# Patient Record
Sex: Male | Born: 1945 | Race: Black or African American | Hispanic: No | Marital: Married | Smoking: Current every day smoker
Health system: Southern US, Community
[De-identification: ages and names within clinical notes are randomized; demographics above are authoritative.]

## PROBLEM LIST (undated history)

## (undated) DIAGNOSIS — L409 Psoriasis, unspecified: Secondary | ICD-10-CM

## (undated) DIAGNOSIS — R52 Pain, unspecified: Secondary | ICD-10-CM

## (undated) DIAGNOSIS — E559 Vitamin D deficiency, unspecified: Secondary | ICD-10-CM

## (undated) DIAGNOSIS — I7300 Raynaud's syndrome without gangrene: Secondary | ICD-10-CM

## (undated) DIAGNOSIS — I10 Essential (primary) hypertension: Secondary | ICD-10-CM

## (undated) DIAGNOSIS — E785 Hyperlipidemia, unspecified: Secondary | ICD-10-CM

## (undated) DIAGNOSIS — Z720 Tobacco use: Secondary | ICD-10-CM

## (undated) DIAGNOSIS — N400 Enlarged prostate without lower urinary tract symptoms: Secondary | ICD-10-CM

## (undated) DIAGNOSIS — D709 Neutropenia, unspecified: Secondary | ICD-10-CM

## (undated) DIAGNOSIS — E039 Hypothyroidism, unspecified: Secondary | ICD-10-CM

## (undated) DIAGNOSIS — J309 Allergic rhinitis, unspecified: Secondary | ICD-10-CM

## (undated) HISTORY — DX: Hypothyroidism, unspecified: E03.9

## (undated) HISTORY — DX: Allergic rhinitis, unspecified: J30.9

## (undated) HISTORY — DX: Pain, unspecified: R52

## (undated) HISTORY — DX: Raynaud's syndrome without gangrene: I73.00

## (undated) HISTORY — DX: Tobacco use: Z72.0

## (undated) HISTORY — DX: Benign prostatic hyperplasia without lower urinary tract symptoms: N40.0

## (undated) HISTORY — DX: Neutropenia, unspecified: D70.9

## (undated) HISTORY — DX: Psoriasis, unspecified: L40.9

## (undated) HISTORY — DX: Essential (primary) hypertension: I10

## (undated) HISTORY — DX: Vitamin D deficiency, unspecified: E55.9

## (undated) HISTORY — DX: Hyperlipidemia, unspecified: E78.5

---

## 2002-08-13 ENCOUNTER — Ambulatory Visit (HOSPITAL_COMMUNITY): Admission: RE | Admit: 2002-08-13 | Discharge: 2002-08-13 | Payer: Self-pay | Admitting: *Deleted

## 2004-04-15 ENCOUNTER — Encounter: Admission: RE | Admit: 2004-04-15 | Discharge: 2004-04-15 | Payer: Self-pay | Admitting: Family Medicine

## 2004-04-28 ENCOUNTER — Encounter: Admission: RE | Admit: 2004-04-28 | Discharge: 2004-04-28 | Payer: Self-pay | Admitting: Family Medicine

## 2004-11-18 ENCOUNTER — Encounter: Admission: RE | Admit: 2004-11-18 | Discharge: 2004-11-18 | Payer: Self-pay | Admitting: Family Medicine

## 2008-09-30 ENCOUNTER — Encounter: Admission: RE | Admit: 2008-09-30 | Discharge: 2008-09-30 | Payer: Self-pay | Admitting: Allergy and Immunology

## 2008-11-14 ENCOUNTER — Encounter: Admission: RE | Admit: 2008-11-14 | Discharge: 2008-11-14 | Payer: Self-pay | Admitting: Family Medicine

## 2009-10-15 ENCOUNTER — Encounter: Admission: RE | Admit: 2009-10-15 | Discharge: 2009-10-15 | Payer: Self-pay | Admitting: Allergy and Immunology

## 2009-12-16 ENCOUNTER — Encounter: Admission: RE | Admit: 2009-12-16 | Discharge: 2009-12-16 | Payer: Self-pay | Admitting: Family Medicine

## 2010-06-11 ENCOUNTER — Other Ambulatory Visit: Payer: Self-pay | Admitting: Family Medicine

## 2010-06-14 ENCOUNTER — Ambulatory Visit
Admission: RE | Admit: 2010-06-14 | Discharge: 2010-06-14 | Disposition: A | Discharge status: Home / Self Care | Payer: Medicare Other | Source: Ambulatory Visit | Attending: Family Medicine | Admitting: Family Medicine

## 2011-04-04 DIAGNOSIS — Z79899 Other long term (current) drug therapy: Secondary | ICD-10-CM | POA: Diagnosis not present

## 2011-04-04 DIAGNOSIS — Z23 Encounter for immunization: Secondary | ICD-10-CM | POA: Diagnosis not present

## 2011-04-04 DIAGNOSIS — E039 Hypothyroidism, unspecified: Secondary | ICD-10-CM | POA: Diagnosis not present

## 2011-04-04 DIAGNOSIS — E559 Vitamin D deficiency, unspecified: Secondary | ICD-10-CM | POA: Diagnosis not present

## 2011-04-04 DIAGNOSIS — E7800 Pure hypercholesterolemia, unspecified: Secondary | ICD-10-CM | POA: Diagnosis not present

## 2011-04-04 DIAGNOSIS — I10 Essential (primary) hypertension: Secondary | ICD-10-CM | POA: Diagnosis not present

## 2011-04-04 DIAGNOSIS — Z0000 Encounter for general adult medical examination without abnormal findings: Secondary | ICD-10-CM | POA: Diagnosis not present

## 2011-07-13 DIAGNOSIS — L259 Unspecified contact dermatitis, unspecified cause: Secondary | ICD-10-CM | POA: Diagnosis not present

## 2011-07-13 DIAGNOSIS — J3089 Other allergic rhinitis: Secondary | ICD-10-CM | POA: Diagnosis not present

## 2011-07-13 DIAGNOSIS — J019 Acute sinusitis, unspecified: Secondary | ICD-10-CM | POA: Diagnosis not present

## 2011-07-22 DIAGNOSIS — J019 Acute sinusitis, unspecified: Secondary | ICD-10-CM | POA: Diagnosis not present

## 2011-07-22 DIAGNOSIS — J3089 Other allergic rhinitis: Secondary | ICD-10-CM | POA: Diagnosis not present

## 2011-07-22 DIAGNOSIS — L259 Unspecified contact dermatitis, unspecified cause: Secondary | ICD-10-CM | POA: Diagnosis not present

## 2011-10-10 DIAGNOSIS — J3089 Other allergic rhinitis: Secondary | ICD-10-CM | POA: Diagnosis not present

## 2011-10-10 DIAGNOSIS — J019 Acute sinusitis, unspecified: Secondary | ICD-10-CM | POA: Diagnosis not present

## 2011-10-10 DIAGNOSIS — L259 Unspecified contact dermatitis, unspecified cause: Secondary | ICD-10-CM | POA: Diagnosis not present

## 2011-10-17 DIAGNOSIS — E7800 Pure hypercholesterolemia, unspecified: Secondary | ICD-10-CM | POA: Diagnosis not present

## 2011-10-17 DIAGNOSIS — I10 Essential (primary) hypertension: Secondary | ICD-10-CM | POA: Diagnosis not present

## 2011-10-17 DIAGNOSIS — J329 Chronic sinusitis, unspecified: Secondary | ICD-10-CM | POA: Diagnosis not present

## 2011-10-17 DIAGNOSIS — H6120 Impacted cerumen, unspecified ear: Secondary | ICD-10-CM | POA: Diagnosis not present

## 2011-10-17 DIAGNOSIS — Z79899 Other long term (current) drug therapy: Secondary | ICD-10-CM | POA: Diagnosis not present

## 2011-10-17 DIAGNOSIS — E559 Vitamin D deficiency, unspecified: Secondary | ICD-10-CM | POA: Diagnosis not present

## 2011-11-22 DIAGNOSIS — N401 Enlarged prostate with lower urinary tract symptoms: Secondary | ICD-10-CM | POA: Diagnosis not present

## 2012-01-25 DIAGNOSIS — J3089 Other allergic rhinitis: Secondary | ICD-10-CM | POA: Diagnosis not present

## 2012-01-25 DIAGNOSIS — L259 Unspecified contact dermatitis, unspecified cause: Secondary | ICD-10-CM | POA: Diagnosis not present

## 2012-01-25 DIAGNOSIS — J019 Acute sinusitis, unspecified: Secondary | ICD-10-CM | POA: Diagnosis not present

## 2012-04-02 DIAGNOSIS — R209 Unspecified disturbances of skin sensation: Secondary | ICD-10-CM | POA: Diagnosis not present

## 2012-04-06 DIAGNOSIS — J320 Chronic maxillary sinusitis: Secondary | ICD-10-CM | POA: Diagnosis not present

## 2012-04-06 DIAGNOSIS — J310 Chronic rhinitis: Secondary | ICD-10-CM | POA: Diagnosis not present

## 2012-04-06 DIAGNOSIS — H6120 Impacted cerumen, unspecified ear: Secondary | ICD-10-CM | POA: Diagnosis not present

## 2012-04-09 ENCOUNTER — Other Ambulatory Visit (INDEPENDENT_AMBULATORY_CARE_PROVIDER_SITE_OTHER): Payer: Self-pay | Admitting: Otolaryngology

## 2012-04-09 DIAGNOSIS — J329 Chronic sinusitis, unspecified: Secondary | ICD-10-CM

## 2012-04-11 DIAGNOSIS — Z79899 Other long term (current) drug therapy: Secondary | ICD-10-CM | POA: Diagnosis not present

## 2012-04-11 DIAGNOSIS — Z23 Encounter for immunization: Secondary | ICD-10-CM | POA: Diagnosis not present

## 2012-04-11 DIAGNOSIS — F17200 Nicotine dependence, unspecified, uncomplicated: Secondary | ICD-10-CM | POA: Diagnosis not present

## 2012-04-11 DIAGNOSIS — I10 Essential (primary) hypertension: Secondary | ICD-10-CM | POA: Diagnosis not present

## 2012-04-11 DIAGNOSIS — E039 Hypothyroidism, unspecified: Secondary | ICD-10-CM | POA: Diagnosis not present

## 2012-04-11 DIAGNOSIS — E559 Vitamin D deficiency, unspecified: Secondary | ICD-10-CM | POA: Diagnosis not present

## 2012-04-11 DIAGNOSIS — E7800 Pure hypercholesterolemia, unspecified: Secondary | ICD-10-CM | POA: Diagnosis not present

## 2012-04-11 DIAGNOSIS — Z0000 Encounter for general adult medical examination without abnormal findings: Secondary | ICD-10-CM | POA: Diagnosis not present

## 2012-04-11 DIAGNOSIS — Z1331 Encounter for screening for depression: Secondary | ICD-10-CM | POA: Diagnosis not present

## 2012-04-13 ENCOUNTER — Ambulatory Visit
Admission: RE | Admit: 2012-04-13 | Discharge: 2012-04-13 | Disposition: A | Discharge status: Home / Self Care | Payer: Medicare Other | Source: Ambulatory Visit | Attending: Otolaryngology | Admitting: Otolaryngology

## 2012-04-13 DIAGNOSIS — J320 Chronic maxillary sinusitis: Secondary | ICD-10-CM | POA: Diagnosis not present

## 2012-04-13 DIAGNOSIS — J329 Chronic sinusitis, unspecified: Secondary | ICD-10-CM

## 2012-04-27 DIAGNOSIS — J343 Hypertrophy of nasal turbinates: Secondary | ICD-10-CM | POA: Diagnosis not present

## 2012-04-27 DIAGNOSIS — J310 Chronic rhinitis: Secondary | ICD-10-CM | POA: Diagnosis not present

## 2012-04-27 DIAGNOSIS — J0100 Acute maxillary sinusitis, unspecified: Secondary | ICD-10-CM | POA: Diagnosis not present

## 2012-05-09 DIAGNOSIS — F17200 Nicotine dependence, unspecified, uncomplicated: Secondary | ICD-10-CM | POA: Diagnosis not present

## 2012-05-09 DIAGNOSIS — I10 Essential (primary) hypertension: Secondary | ICD-10-CM | POA: Diagnosis not present

## 2012-10-22 DIAGNOSIS — J322 Chronic ethmoidal sinusitis: Secondary | ICD-10-CM | POA: Diagnosis not present

## 2012-10-22 DIAGNOSIS — J0100 Acute maxillary sinusitis, unspecified: Secondary | ICD-10-CM | POA: Diagnosis not present

## 2012-10-22 DIAGNOSIS — J320 Chronic maxillary sinusitis: Secondary | ICD-10-CM | POA: Diagnosis not present

## 2012-11-12 DIAGNOSIS — J0100 Acute maxillary sinusitis, unspecified: Secondary | ICD-10-CM | POA: Diagnosis not present

## 2012-11-12 DIAGNOSIS — J343 Hypertrophy of nasal turbinates: Secondary | ICD-10-CM | POA: Diagnosis not present

## 2012-11-12 DIAGNOSIS — J310 Chronic rhinitis: Secondary | ICD-10-CM | POA: Diagnosis not present

## 2012-11-26 DIAGNOSIS — J3089 Other allergic rhinitis: Secondary | ICD-10-CM | POA: Diagnosis not present

## 2012-11-26 DIAGNOSIS — L259 Unspecified contact dermatitis, unspecified cause: Secondary | ICD-10-CM | POA: Diagnosis not present

## 2012-12-05 DIAGNOSIS — E039 Hypothyroidism, unspecified: Secondary | ICD-10-CM | POA: Diagnosis not present

## 2012-12-05 DIAGNOSIS — I10 Essential (primary) hypertension: Secondary | ICD-10-CM | POA: Diagnosis not present

## 2012-12-05 DIAGNOSIS — Z1211 Encounter for screening for malignant neoplasm of colon: Secondary | ICD-10-CM | POA: Diagnosis not present

## 2012-12-05 DIAGNOSIS — F17200 Nicotine dependence, unspecified, uncomplicated: Secondary | ICD-10-CM | POA: Diagnosis not present

## 2012-12-05 DIAGNOSIS — Z79899 Other long term (current) drug therapy: Secondary | ICD-10-CM | POA: Diagnosis not present

## 2013-02-18 DIAGNOSIS — J343 Hypertrophy of nasal turbinates: Secondary | ICD-10-CM | POA: Diagnosis not present

## 2013-02-18 DIAGNOSIS — J310 Chronic rhinitis: Secondary | ICD-10-CM | POA: Diagnosis not present

## 2013-02-18 DIAGNOSIS — J0100 Acute maxillary sinusitis, unspecified: Secondary | ICD-10-CM | POA: Diagnosis not present

## 2013-02-18 DIAGNOSIS — H6120 Impacted cerumen, unspecified ear: Secondary | ICD-10-CM | POA: Diagnosis not present

## 2013-03-11 DIAGNOSIS — L259 Unspecified contact dermatitis, unspecified cause: Secondary | ICD-10-CM | POA: Diagnosis not present

## 2013-03-11 DIAGNOSIS — J3089 Other allergic rhinitis: Secondary | ICD-10-CM | POA: Diagnosis not present

## 2013-03-11 DIAGNOSIS — J019 Acute sinusitis, unspecified: Secondary | ICD-10-CM | POA: Diagnosis not present

## 2013-06-10 DIAGNOSIS — E039 Hypothyroidism, unspecified: Secondary | ICD-10-CM | POA: Diagnosis not present

## 2013-06-10 DIAGNOSIS — I10 Essential (primary) hypertension: Secondary | ICD-10-CM | POA: Diagnosis not present

## 2013-06-10 DIAGNOSIS — E7800 Pure hypercholesterolemia, unspecified: Secondary | ICD-10-CM | POA: Diagnosis not present

## 2013-06-10 DIAGNOSIS — F17200 Nicotine dependence, unspecified, uncomplicated: Secondary | ICD-10-CM | POA: Diagnosis not present

## 2013-06-10 DIAGNOSIS — Z1211 Encounter for screening for malignant neoplasm of colon: Secondary | ICD-10-CM | POA: Diagnosis not present

## 2013-06-10 DIAGNOSIS — Z23 Encounter for immunization: Secondary | ICD-10-CM | POA: Diagnosis not present

## 2013-06-10 DIAGNOSIS — Z0000 Encounter for general adult medical examination without abnormal findings: Secondary | ICD-10-CM | POA: Diagnosis not present

## 2013-06-11 ENCOUNTER — Other Ambulatory Visit: Payer: Self-pay | Admitting: Family Medicine

## 2013-06-11 DIAGNOSIS — Z139 Encounter for screening, unspecified: Secondary | ICD-10-CM

## 2013-06-27 DIAGNOSIS — N400 Enlarged prostate without lower urinary tract symptoms: Secondary | ICD-10-CM | POA: Diagnosis not present

## 2013-07-24 DIAGNOSIS — I10 Essential (primary) hypertension: Secondary | ICD-10-CM | POA: Diagnosis not present

## 2013-09-18 DIAGNOSIS — J019 Acute sinusitis, unspecified: Secondary | ICD-10-CM | POA: Diagnosis not present

## 2013-09-18 DIAGNOSIS — L259 Unspecified contact dermatitis, unspecified cause: Secondary | ICD-10-CM | POA: Diagnosis not present

## 2013-09-18 DIAGNOSIS — J3089 Other allergic rhinitis: Secondary | ICD-10-CM | POA: Diagnosis not present

## 2014-01-10 DIAGNOSIS — J310 Chronic rhinitis: Secondary | ICD-10-CM | POA: Diagnosis not present

## 2014-01-10 DIAGNOSIS — H6123 Impacted cerumen, bilateral: Secondary | ICD-10-CM | POA: Diagnosis not present

## 2014-01-10 DIAGNOSIS — J343 Hypertrophy of nasal turbinates: Secondary | ICD-10-CM | POA: Diagnosis not present

## 2014-01-31 DIAGNOSIS — M549 Dorsalgia, unspecified: Secondary | ICD-10-CM | POA: Diagnosis not present

## 2014-02-03 DIAGNOSIS — M4806 Spinal stenosis, lumbar region: Secondary | ICD-10-CM | POA: Diagnosis not present

## 2014-02-03 DIAGNOSIS — M545 Low back pain: Secondary | ICD-10-CM | POA: Diagnosis not present

## 2014-03-03 DIAGNOSIS — J3089 Other allergic rhinitis: Secondary | ICD-10-CM | POA: Diagnosis not present

## 2014-03-03 DIAGNOSIS — J0100 Acute maxillary sinusitis, unspecified: Secondary | ICD-10-CM | POA: Diagnosis not present

## 2014-03-03 DIAGNOSIS — L309 Dermatitis, unspecified: Secondary | ICD-10-CM | POA: Diagnosis not present

## 2014-04-30 DIAGNOSIS — L309 Dermatitis, unspecified: Secondary | ICD-10-CM | POA: Diagnosis not present

## 2014-04-30 DIAGNOSIS — H6121 Impacted cerumen, right ear: Secondary | ICD-10-CM | POA: Diagnosis not present

## 2014-04-30 DIAGNOSIS — J3089 Other allergic rhinitis: Secondary | ICD-10-CM | POA: Diagnosis not present

## 2014-05-02 DIAGNOSIS — H6123 Impacted cerumen, bilateral: Secondary | ICD-10-CM | POA: Diagnosis not present

## 2014-06-17 DIAGNOSIS — L03211 Cellulitis of face: Secondary | ICD-10-CM | POA: Diagnosis not present

## 2014-06-17 DIAGNOSIS — J343 Hypertrophy of nasal turbinates: Secondary | ICD-10-CM | POA: Diagnosis not present

## 2014-06-17 DIAGNOSIS — J310 Chronic rhinitis: Secondary | ICD-10-CM | POA: Diagnosis not present

## 2014-06-24 DIAGNOSIS — L03211 Cellulitis of face: Secondary | ICD-10-CM | POA: Diagnosis not present

## 2014-06-25 DIAGNOSIS — E780 Pure hypercholesterolemia: Secondary | ICD-10-CM | POA: Diagnosis not present

## 2014-06-25 DIAGNOSIS — L409 Psoriasis, unspecified: Secondary | ICD-10-CM | POA: Diagnosis not present

## 2014-06-25 DIAGNOSIS — F17210 Nicotine dependence, cigarettes, uncomplicated: Secondary | ICD-10-CM | POA: Diagnosis not present

## 2014-06-25 DIAGNOSIS — E559 Vitamin D deficiency, unspecified: Secondary | ICD-10-CM | POA: Diagnosis not present

## 2014-06-25 DIAGNOSIS — I10 Essential (primary) hypertension: Secondary | ICD-10-CM | POA: Diagnosis not present

## 2014-06-25 DIAGNOSIS — E039 Hypothyroidism, unspecified: Secondary | ICD-10-CM | POA: Diagnosis not present

## 2014-06-25 DIAGNOSIS — Z5181 Encounter for therapeutic drug level monitoring: Secondary | ICD-10-CM | POA: Diagnosis not present

## 2014-06-25 DIAGNOSIS — Z0000 Encounter for general adult medical examination without abnormal findings: Secondary | ICD-10-CM | POA: Diagnosis not present

## 2014-06-25 DIAGNOSIS — Z1389 Encounter for screening for other disorder: Secondary | ICD-10-CM | POA: Diagnosis not present

## 2014-06-30 DIAGNOSIS — E871 Hypo-osmolality and hyponatremia: Secondary | ICD-10-CM | POA: Diagnosis not present

## 2014-07-14 DIAGNOSIS — E871 Hypo-osmolality and hyponatremia: Secondary | ICD-10-CM | POA: Diagnosis not present

## 2014-07-24 DIAGNOSIS — K640 First degree hemorrhoids: Secondary | ICD-10-CM | POA: Diagnosis not present

## 2014-07-24 DIAGNOSIS — Z1211 Encounter for screening for malignant neoplasm of colon: Secondary | ICD-10-CM | POA: Diagnosis not present

## 2014-08-13 DIAGNOSIS — T148 Other injury of unspecified body region: Secondary | ICD-10-CM | POA: Diagnosis not present

## 2014-09-17 DIAGNOSIS — R3915 Urgency of urination: Secondary | ICD-10-CM | POA: Diagnosis not present

## 2014-09-17 DIAGNOSIS — N401 Enlarged prostate with lower urinary tract symptoms: Secondary | ICD-10-CM | POA: Diagnosis not present

## 2014-11-03 DIAGNOSIS — E780 Pure hypercholesterolemia: Secondary | ICD-10-CM | POA: Diagnosis not present

## 2014-11-07 DIAGNOSIS — E871 Hypo-osmolality and hyponatremia: Secondary | ICD-10-CM | POA: Diagnosis not present

## 2014-11-07 DIAGNOSIS — M25569 Pain in unspecified knee: Secondary | ICD-10-CM | POA: Diagnosis not present

## 2014-11-07 DIAGNOSIS — M791 Myalgia: Secondary | ICD-10-CM | POA: Diagnosis not present

## 2014-12-02 DIAGNOSIS — H6122 Impacted cerumen, left ear: Secondary | ICD-10-CM | POA: Diagnosis not present

## 2014-12-02 DIAGNOSIS — J342 Deviated nasal septum: Secondary | ICD-10-CM | POA: Diagnosis not present

## 2014-12-02 DIAGNOSIS — J310 Chronic rhinitis: Secondary | ICD-10-CM | POA: Diagnosis not present

## 2014-12-24 DIAGNOSIS — L309 Dermatitis, unspecified: Secondary | ICD-10-CM | POA: Diagnosis not present

## 2014-12-24 DIAGNOSIS — J3089 Other allergic rhinitis: Secondary | ICD-10-CM | POA: Diagnosis not present

## 2014-12-24 DIAGNOSIS — J069 Acute upper respiratory infection, unspecified: Secondary | ICD-10-CM | POA: Diagnosis not present

## 2015-03-04 DIAGNOSIS — J3089 Other allergic rhinitis: Secondary | ICD-10-CM | POA: Diagnosis not present

## 2015-03-04 DIAGNOSIS — L309 Dermatitis, unspecified: Secondary | ICD-10-CM | POA: Diagnosis not present

## 2015-03-27 DIAGNOSIS — L309 Dermatitis, unspecified: Secondary | ICD-10-CM | POA: Diagnosis not present

## 2015-03-27 DIAGNOSIS — J3089 Other allergic rhinitis: Secondary | ICD-10-CM | POA: Diagnosis not present

## 2015-03-27 DIAGNOSIS — J069 Acute upper respiratory infection, unspecified: Secondary | ICD-10-CM | POA: Diagnosis not present

## 2015-03-30 DIAGNOSIS — J0101 Acute recurrent maxillary sinusitis: Secondary | ICD-10-CM | POA: Diagnosis not present

## 2015-03-30 DIAGNOSIS — J343 Hypertrophy of nasal turbinates: Secondary | ICD-10-CM | POA: Diagnosis not present

## 2015-03-30 DIAGNOSIS — H6122 Impacted cerumen, left ear: Secondary | ICD-10-CM | POA: Diagnosis not present

## 2015-03-30 DIAGNOSIS — J310 Chronic rhinitis: Secondary | ICD-10-CM | POA: Diagnosis not present

## 2015-06-19 DIAGNOSIS — J310 Chronic rhinitis: Secondary | ICD-10-CM | POA: Diagnosis not present

## 2015-06-19 DIAGNOSIS — J0101 Acute recurrent maxillary sinusitis: Secondary | ICD-10-CM | POA: Diagnosis not present

## 2015-06-29 DIAGNOSIS — E559 Vitamin D deficiency, unspecified: Secondary | ICD-10-CM | POA: Diagnosis not present

## 2015-06-29 DIAGNOSIS — I10 Essential (primary) hypertension: Secondary | ICD-10-CM | POA: Diagnosis not present

## 2015-06-29 DIAGNOSIS — Z0000 Encounter for general adult medical examination without abnormal findings: Secondary | ICD-10-CM | POA: Diagnosis not present

## 2015-06-29 DIAGNOSIS — E7800 Pure hypercholesterolemia, unspecified: Secondary | ICD-10-CM | POA: Diagnosis not present

## 2015-06-29 DIAGNOSIS — E039 Hypothyroidism, unspecified: Secondary | ICD-10-CM | POA: Diagnosis not present

## 2015-06-29 DIAGNOSIS — F17210 Nicotine dependence, cigarettes, uncomplicated: Secondary | ICD-10-CM | POA: Diagnosis not present

## 2015-06-29 DIAGNOSIS — M170 Bilateral primary osteoarthritis of knee: Secondary | ICD-10-CM | POA: Diagnosis not present

## 2015-11-18 DIAGNOSIS — M79672 Pain in left foot: Secondary | ICD-10-CM | POA: Diagnosis not present

## 2015-11-18 DIAGNOSIS — M79671 Pain in right foot: Secondary | ICD-10-CM | POA: Diagnosis not present

## 2015-12-24 DIAGNOSIS — J0191 Acute recurrent sinusitis, unspecified: Secondary | ICD-10-CM | POA: Diagnosis not present

## 2015-12-24 DIAGNOSIS — L309 Dermatitis, unspecified: Secondary | ICD-10-CM | POA: Diagnosis not present

## 2015-12-24 DIAGNOSIS — J3089 Other allergic rhinitis: Secondary | ICD-10-CM | POA: Diagnosis not present

## 2016-01-14 DIAGNOSIS — J309 Allergic rhinitis, unspecified: Secondary | ICD-10-CM | POA: Diagnosis not present

## 2016-01-14 DIAGNOSIS — R42 Dizziness and giddiness: Secondary | ICD-10-CM | POA: Diagnosis not present

## 2016-01-14 DIAGNOSIS — I10 Essential (primary) hypertension: Secondary | ICD-10-CM | POA: Diagnosis not present

## 2016-01-14 DIAGNOSIS — E7800 Pure hypercholesterolemia, unspecified: Secondary | ICD-10-CM | POA: Diagnosis not present

## 2016-01-20 DIAGNOSIS — J343 Hypertrophy of nasal turbinates: Secondary | ICD-10-CM | POA: Diagnosis not present

## 2016-01-20 DIAGNOSIS — J342 Deviated nasal septum: Secondary | ICD-10-CM | POA: Diagnosis not present

## 2016-01-20 DIAGNOSIS — H6123 Impacted cerumen, bilateral: Secondary | ICD-10-CM | POA: Diagnosis not present

## 2016-01-20 DIAGNOSIS — H900 Conductive hearing loss, bilateral: Secondary | ICD-10-CM | POA: Diagnosis not present

## 2016-01-20 DIAGNOSIS — H6983 Other specified disorders of Eustachian tube, bilateral: Secondary | ICD-10-CM | POA: Diagnosis not present

## 2016-01-20 DIAGNOSIS — J310 Chronic rhinitis: Secondary | ICD-10-CM | POA: Diagnosis not present

## 2016-03-17 DIAGNOSIS — F17210 Nicotine dependence, cigarettes, uncomplicated: Secondary | ICD-10-CM | POA: Diagnosis not present

## 2016-03-17 DIAGNOSIS — E039 Hypothyroidism, unspecified: Secondary | ICD-10-CM | POA: Diagnosis not present

## 2016-03-17 DIAGNOSIS — E7800 Pure hypercholesterolemia, unspecified: Secondary | ICD-10-CM | POA: Diagnosis not present

## 2016-03-17 DIAGNOSIS — L309 Dermatitis, unspecified: Secondary | ICD-10-CM | POA: Diagnosis not present

## 2016-03-17 DIAGNOSIS — I10 Essential (primary) hypertension: Secondary | ICD-10-CM | POA: Diagnosis not present

## 2016-05-10 DIAGNOSIS — H6123 Impacted cerumen, bilateral: Secondary | ICD-10-CM | POA: Diagnosis not present

## 2016-05-10 DIAGNOSIS — J342 Deviated nasal septum: Secondary | ICD-10-CM | POA: Diagnosis not present

## 2016-05-10 DIAGNOSIS — J310 Chronic rhinitis: Secondary | ICD-10-CM | POA: Diagnosis not present

## 2016-05-11 DIAGNOSIS — E871 Hypo-osmolality and hyponatremia: Secondary | ICD-10-CM | POA: Diagnosis not present

## 2016-06-07 DIAGNOSIS — N400 Enlarged prostate without lower urinary tract symptoms: Secondary | ICD-10-CM | POA: Diagnosis not present

## 2016-06-07 DIAGNOSIS — Z125 Encounter for screening for malignant neoplasm of prostate: Secondary | ICD-10-CM | POA: Diagnosis not present

## 2016-06-16 DIAGNOSIS — B353 Tinea pedis: Secondary | ICD-10-CM | POA: Diagnosis not present

## 2016-06-16 DIAGNOSIS — M79605 Pain in left leg: Secondary | ICD-10-CM | POA: Diagnosis not present

## 2016-06-30 DIAGNOSIS — M170 Bilateral primary osteoarthritis of knee: Secondary | ICD-10-CM | POA: Diagnosis not present

## 2016-06-30 DIAGNOSIS — M48061 Spinal stenosis, lumbar region without neurogenic claudication: Secondary | ICD-10-CM | POA: Diagnosis not present

## 2016-06-30 DIAGNOSIS — L309 Dermatitis, unspecified: Secondary | ICD-10-CM | POA: Diagnosis not present

## 2016-06-30 DIAGNOSIS — M545 Low back pain: Secondary | ICD-10-CM | POA: Diagnosis not present

## 2016-06-30 DIAGNOSIS — J3089 Other allergic rhinitis: Secondary | ICD-10-CM | POA: Diagnosis not present

## 2016-06-30 DIAGNOSIS — J0111 Acute recurrent frontal sinusitis: Secondary | ICD-10-CM | POA: Diagnosis not present

## 2016-07-11 DIAGNOSIS — M170 Bilateral primary osteoarthritis of knee: Secondary | ICD-10-CM | POA: Diagnosis not present

## 2016-07-11 DIAGNOSIS — Z0000 Encounter for general adult medical examination without abnormal findings: Secondary | ICD-10-CM | POA: Diagnosis not present

## 2016-07-11 DIAGNOSIS — F17210 Nicotine dependence, cigarettes, uncomplicated: Secondary | ICD-10-CM | POA: Diagnosis not present

## 2016-07-11 DIAGNOSIS — E559 Vitamin D deficiency, unspecified: Secondary | ICD-10-CM | POA: Diagnosis not present

## 2016-07-11 DIAGNOSIS — E7800 Pure hypercholesterolemia, unspecified: Secondary | ICD-10-CM | POA: Diagnosis not present

## 2016-07-11 DIAGNOSIS — I10 Essential (primary) hypertension: Secondary | ICD-10-CM | POA: Diagnosis not present

## 2016-07-11 DIAGNOSIS — E039 Hypothyroidism, unspecified: Secondary | ICD-10-CM | POA: Diagnosis not present

## 2016-08-09 DIAGNOSIS — J3089 Other allergic rhinitis: Secondary | ICD-10-CM | POA: Diagnosis not present

## 2016-08-09 DIAGNOSIS — L309 Dermatitis, unspecified: Secondary | ICD-10-CM | POA: Diagnosis not present

## 2016-08-09 DIAGNOSIS — J069 Acute upper respiratory infection, unspecified: Secondary | ICD-10-CM | POA: Diagnosis not present

## 2016-08-26 DIAGNOSIS — J3089 Other allergic rhinitis: Secondary | ICD-10-CM | POA: Diagnosis not present

## 2016-08-26 DIAGNOSIS — L309 Dermatitis, unspecified: Secondary | ICD-10-CM | POA: Diagnosis not present

## 2016-08-26 DIAGNOSIS — F17200 Nicotine dependence, unspecified, uncomplicated: Secondary | ICD-10-CM | POA: Diagnosis not present

## 2016-08-26 DIAGNOSIS — J019 Acute sinusitis, unspecified: Secondary | ICD-10-CM | POA: Diagnosis not present

## 2016-10-18 DIAGNOSIS — B353 Tinea pedis: Secondary | ICD-10-CM | POA: Diagnosis not present

## 2016-10-18 DIAGNOSIS — J320 Chronic maxillary sinusitis: Secondary | ICD-10-CM | POA: Diagnosis not present

## 2016-10-18 DIAGNOSIS — M25569 Pain in unspecified knee: Secondary | ICD-10-CM | POA: Diagnosis not present

## 2016-10-18 DIAGNOSIS — J309 Allergic rhinitis, unspecified: Secondary | ICD-10-CM | POA: Diagnosis not present

## 2016-11-23 DIAGNOSIS — L309 Dermatitis, unspecified: Secondary | ICD-10-CM | POA: Diagnosis not present

## 2016-11-23 DIAGNOSIS — J3089 Other allergic rhinitis: Secondary | ICD-10-CM | POA: Diagnosis not present

## 2016-11-23 DIAGNOSIS — J069 Acute upper respiratory infection, unspecified: Secondary | ICD-10-CM | POA: Diagnosis not present

## 2017-01-25 DIAGNOSIS — F17210 Nicotine dependence, cigarettes, uncomplicated: Secondary | ICD-10-CM | POA: Diagnosis not present

## 2017-01-25 DIAGNOSIS — H6123 Impacted cerumen, bilateral: Secondary | ICD-10-CM | POA: Diagnosis not present

## 2017-01-25 DIAGNOSIS — Z23 Encounter for immunization: Secondary | ICD-10-CM | POA: Diagnosis not present

## 2017-01-25 DIAGNOSIS — I10 Essential (primary) hypertension: Secondary | ICD-10-CM | POA: Diagnosis not present

## 2017-01-25 DIAGNOSIS — E039 Hypothyroidism, unspecified: Secondary | ICD-10-CM | POA: Diagnosis not present

## 2017-01-25 DIAGNOSIS — E7800 Pure hypercholesterolemia, unspecified: Secondary | ICD-10-CM | POA: Diagnosis not present

## 2017-03-09 DIAGNOSIS — E039 Hypothyroidism, unspecified: Secondary | ICD-10-CM | POA: Diagnosis not present

## 2017-04-04 DIAGNOSIS — F17200 Nicotine dependence, unspecified, uncomplicated: Secondary | ICD-10-CM | POA: Diagnosis not present

## 2017-04-04 DIAGNOSIS — L309 Dermatitis, unspecified: Secondary | ICD-10-CM | POA: Diagnosis not present

## 2017-04-04 DIAGNOSIS — J0100 Acute maxillary sinusitis, unspecified: Secondary | ICD-10-CM | POA: Diagnosis not present

## 2017-04-04 DIAGNOSIS — J3089 Other allergic rhinitis: Secondary | ICD-10-CM | POA: Diagnosis not present

## 2017-04-26 ENCOUNTER — Other Ambulatory Visit: Payer: Self-pay | Admitting: Family Medicine

## 2017-04-26 ENCOUNTER — Ambulatory Visit
Admission: RE | Admit: 2017-04-26 | Discharge: 2017-04-26 | Disposition: A | Discharge status: Home / Self Care | Payer: Medicare Other | Source: Ambulatory Visit | Attending: Family Medicine | Admitting: Family Medicine

## 2017-04-26 DIAGNOSIS — M25561 Pain in right knee: Secondary | ICD-10-CM | POA: Diagnosis not present

## 2017-04-26 DIAGNOSIS — E039 Hypothyroidism, unspecified: Secondary | ICD-10-CM | POA: Diagnosis not present

## 2017-04-26 DIAGNOSIS — R52 Pain, unspecified: Secondary | ICD-10-CM

## 2017-04-26 DIAGNOSIS — M25569 Pain in unspecified knee: Secondary | ICD-10-CM | POA: Diagnosis not present

## 2017-04-26 DIAGNOSIS — M25562 Pain in left knee: Secondary | ICD-10-CM | POA: Diagnosis not present

## 2017-05-09 DIAGNOSIS — H6123 Impacted cerumen, bilateral: Secondary | ICD-10-CM | POA: Diagnosis not present

## 2017-05-09 DIAGNOSIS — J343 Hypertrophy of nasal turbinates: Secondary | ICD-10-CM | POA: Diagnosis not present

## 2017-05-09 DIAGNOSIS — J310 Chronic rhinitis: Secondary | ICD-10-CM | POA: Diagnosis not present

## 2017-05-09 DIAGNOSIS — J342 Deviated nasal septum: Secondary | ICD-10-CM | POA: Diagnosis not present

## 2017-05-16 DIAGNOSIS — M791 Myalgia, unspecified site: Secondary | ICD-10-CM | POA: Diagnosis not present

## 2017-05-16 DIAGNOSIS — E559 Vitamin D deficiency, unspecified: Secondary | ICD-10-CM | POA: Diagnosis not present

## 2017-05-16 DIAGNOSIS — E039 Hypothyroidism, unspecified: Secondary | ICD-10-CM | POA: Diagnosis not present

## 2017-06-01 DIAGNOSIS — J329 Chronic sinusitis, unspecified: Secondary | ICD-10-CM | POA: Diagnosis not present

## 2017-06-01 DIAGNOSIS — F17210 Nicotine dependence, cigarettes, uncomplicated: Secondary | ICD-10-CM | POA: Diagnosis not present

## 2017-06-01 DIAGNOSIS — M79604 Pain in right leg: Secondary | ICD-10-CM | POA: Diagnosis not present

## 2017-06-01 DIAGNOSIS — B353 Tinea pedis: Secondary | ICD-10-CM | POA: Diagnosis not present

## 2017-06-05 ENCOUNTER — Emergency Department (HOSPITAL_COMMUNITY): Payer: Medicare Other

## 2017-06-05 ENCOUNTER — Emergency Department (HOSPITAL_COMMUNITY)
Admission: EM | Admit: 2017-06-05 | Discharge: 2017-06-05 | Disposition: A | Discharge status: Home / Self Care | Payer: Medicare Other | Attending: Emergency Medicine | Admitting: Emergency Medicine

## 2017-06-05 ENCOUNTER — Encounter (HOSPITAL_COMMUNITY): Payer: Self-pay | Admitting: Emergency Medicine

## 2017-06-05 DIAGNOSIS — T189XXA Foreign body of alimentary tract, part unspecified, initial encounter: Secondary | ICD-10-CM | POA: Insufficient documentation

## 2017-06-05 DIAGNOSIS — F17210 Nicotine dependence, cigarettes, uncomplicated: Secondary | ICD-10-CM | POA: Diagnosis not present

## 2017-06-05 DIAGNOSIS — X58XXXA Exposure to other specified factors, initial encounter: Secondary | ICD-10-CM | POA: Insufficient documentation

## 2017-06-05 DIAGNOSIS — Y9389 Activity, other specified: Secondary | ICD-10-CM | POA: Insufficient documentation

## 2017-06-05 DIAGNOSIS — S1095XA Superficial foreign body of unspecified part of neck, initial encounter: Secondary | ICD-10-CM | POA: Diagnosis not present

## 2017-06-05 DIAGNOSIS — I10 Essential (primary) hypertension: Secondary | ICD-10-CM | POA: Diagnosis not present

## 2017-06-05 DIAGNOSIS — Y929 Unspecified place or not applicable: Secondary | ICD-10-CM | POA: Diagnosis not present

## 2017-06-05 DIAGNOSIS — Z7982 Long term (current) use of aspirin: Secondary | ICD-10-CM | POA: Insufficient documentation

## 2017-06-05 DIAGNOSIS — E039 Hypothyroidism, unspecified: Secondary | ICD-10-CM | POA: Insufficient documentation

## 2017-06-05 DIAGNOSIS — Z79899 Other long term (current) drug therapy: Secondary | ICD-10-CM | POA: Diagnosis not present

## 2017-06-05 DIAGNOSIS — M795 Residual foreign body in soft tissue: Secondary | ICD-10-CM

## 2017-06-05 DIAGNOSIS — Y999 Unspecified external cause status: Secondary | ICD-10-CM | POA: Insufficient documentation

## 2017-06-05 NOTE — ED Provider Notes (Signed)
[facility]-EMERGENCY DEPT Provider Note   CSN: [id number] Arrival date & time: 06/05/17  1503     History   Chief Complaint Chief Complaint  Patient presents with  . Foreign Body in Throat    HPI Rickey Austin is a 72 y.o. male who presents to the ED with ? F/b in throat. Patient reports eating a pepperment candy and it feels like it is stuck in his throat. Patient is able to speak and swallow.  HPI  Past Medical History:  Diagnosis Date  . Allergic rhinitis   . BPH (benign prostatic hyperplasia)     Dr. Brunilda PayorNesi  . Hyperlipidemia   . Hypertension   . Hypothyroidism    Unspecified   . Neutropenia (HCC)    Neutropenia and macrocytosis  . Pain    Joint pains, multiple - including DDD  . Psoriasis   . Raynaud's phenomenon   . Tobacco abuse   . Tobacco abuse   . Vitamin D deficiency     There are no active problems to display for this patient.   History reviewed. No pertinent surgical history.     Home Medications    Prior to Admission medications   Medication Sig Start Date End Date Taking? Authorizing Provider  aspirin 81 MG tablet Take 81 mg by mouth daily. Aspir-81 81 MG Tablet Delayed Release 1 tablet once a day    [provider]  Cetirizine HCl (ZYRTEC ALLERGY PO) Take by mouth. ZyrTEC 10 MG Tablet Chewable 1 tablet as needed    [provider]  Cholecalciferol (VITAMIN D PO) Take by mouth. Vitamin D 5000 IU Tablet 1 tablet occas    [provider]  Fluticasone Propionate (FLONASE NA) Place into the nose. Flonase 50 MCG/ACT Suspension 1 spray in each nostril Once a day    [provider]  Ibuprofen (ADVIL PO) Take by mouth. Advil 200 MG Tablet 1 tablet as needed    [provider]  Levothyroxine Sodium (SYNTHROID PO) Take by mouth. Synthroid 100 MCG Tablet 1 tablet once a day    [provider]  lisinopril (PRINIVIL,ZESTRIL) 10 MG tablet Take 10 mg by mouth daily. Lisinopril 10 MG  Tablet 1 tablet once a day    [provider]  MAGNESIUM OXIDE PO Take by mouth. Magnesium Oxide 400 MG Tablet 1 tablet as needed    [provider]  Multiple Vitamin (MULTIVITAMIN) tablet Take 1 tablet by mouth daily. For Him 50+ Tablet 1 tablet once a day    [provider]    Family History Family History  Problem Relation Age of Onset  . Osteoarthritis Mother   . Hypertension Mother   . Heart attack Father   . Gout Sister   . Diabetes Sister   . Diabetes Brother   . Heart disease Brother   . Gout Brother   . Diabetes Brother     Social History Social History   Tobacco Use  . Smoking status: Current Every Day Smoker  . Smokeless tobacco: Never Used  Substance Use Topics  . Alcohol use: No    Frequency: Never  . Drug use: No     Allergies   Avelox [moxifloxacin hcl in nacl]; Levaquin [levofloxacin in d5w]; and Penicillins   Review of Systems Review of Systems  HENT: Positive for sore throat.   All other systems reviewed and are negative.    Physical Exam Updated Vital Signs BP (!) 159/77 (BP Location: Right Arm)  Pulse 74   Temp 97.7 F (36.5 C) (Oral)   Resp 16   Ht 5\' 6"  (1.676 m)   Wt 77.6 kg (171 lb)   SpO2 99%   BMI 27.60 kg/m   Physical Exam  Constitutional: He is oriented to person, place, and time. He appears well-developed and well-nourished. No distress.  HENT:  Head: Normocephalic.  Mouth/Throat: Uvula is midline, oropharynx is clear and moist and mucous membranes are normal. No uvula swelling. No posterior oropharyngeal edema or posterior oropharyngeal erythema.  No foreign body noted in throat.  Eyes: EOM are normal.  Neck: Neck supple.  Cardiovascular: Normal rate.  Pulmonary/Chest: Effort normal.  Abdominal: Soft. There is no tenderness.  Musculoskeletal: Normal range of motion.  Neurological: He is alert and oriented to person, place, and time. No cranial nerve deficit.  Skin: Skin is warm and dry.    Nursing note and vitals reviewed.    ED Treatments / Results  Labs (all labs ordered are listed, but only abnormal results are displayed) Labs Reviewed - No data to display  Radiology Dg Neck Soft Tissue  Result Date: 06/05/2017 CLINICAL DATA:  Sensation of foreign body in the neck. The patient choked on a peppermint today. EXAM: NECK SOFT TISSUES - 1+ VIEW COMPARISON:  Cervical MRI dated 07/02/2007 FINDINGS: There is no evidence of retropharyngeal soft tissue swelling or epiglottic enlargement. The cervical airway is unremarkable and no radio-opaque foreign body identified. Slight calcifications in the carotid arteries. IMPRESSION: Negative. Electronically Signed   By: Francene Boyers M.D.   On: 06/05/2017 15:46    Procedures Procedures (including critical care time)  Medications Ordered in ED Medications - No data to display   Initial Impression / Assessment and Plan / ED Course  I have reviewed the triage vital signs and the nursing notes. 72 y.o. male here with sore throat after swallowing a piece of hard candy. No respiratory symptoms, no difficulty swallowing stable for d/c. Dr. Rodena Medin in to examine the patient and discuss plan of care.   Final Clinical Impressions(s) / ED Diagnoses   Final diagnoses:  Swallowed foreign body, initial encounter    ED Discharge Orders    None       Kerrie Buffalo [place], [place] 06/05/17 2215    Wynetta Fines, MD 06/05/17 2240

## 2017-06-05 NOTE — ED Triage Notes (Signed)
Patient reports about 30 mins ago eating a peppermint hard candy and feels that it is stuck in his throat. Pt able to speak and maintain saliva.

## 2017-06-05 NOTE — ED Notes (Signed)
Bed: [facility]PT1 Expected date:  Expected time:  Means of arrival:  Comments: 

## 2017-06-10 DIAGNOSIS — J0100 Acute maxillary sinusitis, unspecified: Secondary | ICD-10-CM | POA: Diagnosis not present

## 2017-06-12 ENCOUNTER — Encounter (HOSPITAL_COMMUNITY): Payer: Self-pay | Admitting: Emergency Medicine

## 2017-06-12 ENCOUNTER — Emergency Department (HOSPITAL_COMMUNITY)
Admission: EM | Admit: 2017-06-12 | Discharge: 2017-06-12 | Disposition: A | Discharge status: Home / Self Care | Payer: Medicare Other | Attending: Emergency Medicine | Admitting: Emergency Medicine

## 2017-06-12 DIAGNOSIS — Z79899 Other long term (current) drug therapy: Secondary | ICD-10-CM | POA: Diagnosis not present

## 2017-06-12 DIAGNOSIS — I10 Essential (primary) hypertension: Secondary | ICD-10-CM

## 2017-06-12 DIAGNOSIS — R0981 Nasal congestion: Secondary | ICD-10-CM | POA: Diagnosis present

## 2017-06-12 DIAGNOSIS — Z7982 Long term (current) use of aspirin: Secondary | ICD-10-CM | POA: Insufficient documentation

## 2017-06-12 DIAGNOSIS — J0100 Acute maxillary sinusitis, unspecified: Secondary | ICD-10-CM | POA: Diagnosis not present

## 2017-06-12 DIAGNOSIS — F17200 Nicotine dependence, unspecified, uncomplicated: Secondary | ICD-10-CM | POA: Diagnosis not present

## 2017-06-12 DIAGNOSIS — E039 Hypothyroidism, unspecified: Secondary | ICD-10-CM | POA: Diagnosis not present

## 2017-06-12 NOTE — ED Provider Notes (Signed)
[facility]-EMERGENCY DEPT Provider Note   CSN: [id number] Arrival date & time: 06/12/17  1804     History   Chief Complaint Chief Complaint  Patient presents with  . Headache  . Nasal Congestion    HPI Stirling Orton is a 72 y.o. male.  Patient is a 72 year old male with a history of allergic rhinitis, hypertension, hyperlipidemia and BPH who presents with elevated blood pressure.  He states that over the last 3-4 days he has had sinus congestion with pressure in his sinuses as well as clogged ears.  He went to urgent care 2 days ago and was diagnosed with sinusitis.  He was started on cefdinir.  He has Flonase at home which he has been using as well.  He has had prior issues with sinusitis and is followed by Dr. Suszanne Conners with ENT.  He has a little bit of pressure feeling in his sinuses but no other headaches.  He has some minor lightheadedness on standing but no persistent dizziness.  No ataxia.  No numbness or weakness in his extremities.  No vision changes.  No nausea or vomiting.  No chest pain or shortness of breath.  He does note that his blood pressures been intermittently elevated over the last week.  At times it goes up to 180 systolic but then it goes back down to his baseline which is around 130-140 systolic.  He was concerned about his blood pressure being elevated and came here for further evaluation.     Past Medical History:  Diagnosis Date  . Allergic rhinitis   . BPH (benign prostatic hyperplasia)     Dr. Brunilda Payor  . Hyperlipidemia   . Hypertension   . Hypothyroidism    Unspecified   . Neutropenia (HCC)    Neutropenia and macrocytosis  . Pain    Joint pains, multiple - including DDD  . Psoriasis   . Raynaud's phenomenon   . Tobacco abuse   . Tobacco abuse   . Vitamin D deficiency     There are no active problems to display for this patient.   History reviewed. No pertinent surgical history.      Home Medications    Prior to Admission  medications   Medication Sig Start Date End Date Taking? Authorizing Provider  aspirin 81 MG tablet Take 81 mg by mouth daily. Aspir-81 81 MG Tablet Delayed Release 1 tablet once a day   Yes [provider]  cefdinir (OMNICEF) 300 MG capsule TAKE 1 CAPSULE BY MOUTH TWICE A DAY FOR 10 DAYS 06/01/17  Yes [provider]  Ibuprofen (ADVIL PO) Take 200 mg by mouth daily as needed (blood pressue). Advil 200 MG Tablet 1 tablet as needed    Yes [provider]  Levothyroxine Sodium (SYNTHROID PO) Take by mouth. Synthroid 100 MCG Tablet 1 tablet once a day   Yes [provider]  lisinopril (PRINIVIL,ZESTRIL) 10 MG tablet Take 10 mg by mouth 2 (two) times daily.    Yes [provider]  Multiple Vitamin (MULTIVITAMIN) tablet Take 1 tablet by mouth daily. For Him 50+ Tablet 1 tablet once a day   Yes [provider]  SYNTHROID 75 MCG tablet TAKE 1 TABLET BY MOUTH EVERY DAY IN THE MORNING ON EMPTY STOMACH 05/25/17  Yes [provider]  Cetirizine HCl (ZYRTEC ALLERGY PO) Take 1 tablet by mouth daily as needed (allergies). ZyrTEC 10 MG Tablet Chewable 1 tablet as needed     [provider]  Cholecalciferol (VITAMIN D PO) Take 1 tablet by mouth daily as needed (nutrition).     [provider]    Family History Family History  Problem Relation Age of Onset  . Osteoarthritis Mother   . Hypertension Mother   . Heart attack Father   . Gout Sister   . Diabetes Sister   . Diabetes Brother   . Heart disease Brother   . Gout Brother   . Diabetes Brother     Social History Social History   Tobacco Use  . Smoking status: Current Every Day Smoker  . Smokeless tobacco: Never Used  Substance Use Topics  . Alcohol use: No    Frequency: Never  . Drug use: No     Allergies   Avelox [moxifloxacin hcl in nacl]; Levaquin [levofloxacin in d5w]; and Penicillins   Review of Systems Review of Systems  Constitutional: Negative for  chills, diaphoresis, fatigue and fever.  HENT: Positive for congestion. Negative for rhinorrhea and sneezing.   Eyes: Negative.   Respiratory: Negative for cough, chest tightness and shortness of breath.   Cardiovascular: Negative for chest pain and leg swelling.  Gastrointestinal: Negative for abdominal pain, blood in stool, diarrhea, nausea and vomiting.  Genitourinary: Negative for difficulty urinating, flank pain, frequency and hematuria.  Musculoskeletal: Negative for arthralgias and back pain.  Skin: Negative for rash.  Neurological: Positive for light-headedness and headaches. Negative for dizziness, speech difficulty, weakness and numbness.     Physical Exam Updated Vital Signs BP (!) 161/83 (BP Location: Right Arm)   Pulse 70   Temp 98.2 F (36.8 C) (Oral)   Resp (!) 24   SpO2 98%   Physical Exam  Constitutional: He is oriented to person, place, and time. He appears well-developed and well-nourished.  HENT:  Head: Normocephalic and atraumatic.  Right Ear: Tympanic membrane normal.  Left Ear: Tympanic membrane normal.  Mouth/Throat: Oropharynx is clear and moist.  Mild tenderness over both maxillary sinuses and frontal sinuses, clear fluid behind the TMs bilaterally  Eyes: Pupils are equal, round, and reactive to light.  Neck: Normal range of motion. Neck supple.  Cardiovascular: Normal rate, regular rhythm and normal heart sounds.  Pulmonary/Chest: Effort normal and breath sounds normal. No respiratory distress. He has no wheezes. He has no rales. He exhibits no tenderness.  Abdominal: Soft. Bowel sounds are normal. There is no tenderness. There is no rebound and no guarding.  Musculoskeletal: Normal range of motion. He exhibits no edema.  Lymphadenopathy:    He has no cervical adenopathy.  Neurological: He is alert and oriented to person, place, and time.  Patient has normal gait.  He has normal motor function and sensation in all extremities  Skin: Skin is warm and  dry. No rash noted.  Psychiatric: He has a normal mood and affect.     ED Treatments / Results  Labs (all labs ordered are listed, but only abnormal results are displayed) Labs Reviewed - No data to display  EKG None  Radiology No results found.  Procedures Procedures (including critical care time)  Medications Ordered in ED Medications - No data to display   Initial Impression / Assessment and Plan / ED Course  I have reviewed the triage vital signs and the nursing notes.  Pertinent labs & imaging results that were available during my care of the patient were reviewed by me and considered in my medical decision making (see chart for details).     Patient is a 72 year old male who  presents with sinus congestion.  He symptoms are consistent with sinusitis.  He is currently on Flonase and Ceftin ear.  He also is going to try to use a Nettie pot at home.  He has been concerned because his blood pressures have been elevated.  They are not concerning the elevated today.  He does not symptomatic from his blood pressures.  He is neurologically intact.  He has no fever or systemic signs of illness.  He was discharged home in good condition.  He was encouraged to follow-up with his PCP for recheck within the next 2-3 days.  Final Clinical Impressions(s) / ED Diagnoses   Final diagnoses:  Acute maxillary sinusitis, recurrence not specified  Essential hypertension    ED Discharge Orders    None       Rolan BuccoBelfi, Judy Goodenow, MD 06/12/17 2212

## 2017-06-12 NOTE — ED Triage Notes (Signed)
Patient c/o headache, bilateral ear pain, and nasal congestion x4 days. Denies cough, chest pain and SOB. Denies blurred vision and weakness in extremities. Reports he was seen at [facility] walk in clinic on Saturday and dx with sinusitis. Ambulatory.

## 2017-06-14 DIAGNOSIS — J329 Chronic sinusitis, unspecified: Secondary | ICD-10-CM | POA: Diagnosis not present

## 2017-06-14 DIAGNOSIS — I10 Essential (primary) hypertension: Secondary | ICD-10-CM | POA: Diagnosis not present

## 2017-06-15 ENCOUNTER — Other Ambulatory Visit: Payer: Self-pay | Admitting: Allergy and Immunology

## 2017-06-15 ENCOUNTER — Ambulatory Visit
Admission: RE | Admit: 2017-06-15 | Discharge: 2017-06-15 | Disposition: A | Discharge status: Home / Self Care | Payer: Medicare Other | Source: Ambulatory Visit | Attending: Allergy and Immunology | Admitting: Allergy and Immunology

## 2017-06-15 DIAGNOSIS — J0101 Acute recurrent maxillary sinusitis: Secondary | ICD-10-CM | POA: Diagnosis not present

## 2017-06-15 DIAGNOSIS — L309 Dermatitis, unspecified: Secondary | ICD-10-CM | POA: Diagnosis not present

## 2017-06-15 DIAGNOSIS — J3089 Other allergic rhinitis: Secondary | ICD-10-CM | POA: Diagnosis not present

## 2017-06-15 DIAGNOSIS — F17200 Nicotine dependence, unspecified, uncomplicated: Secondary | ICD-10-CM | POA: Diagnosis not present

## 2017-06-15 DIAGNOSIS — J9811 Atelectasis: Secondary | ICD-10-CM | POA: Diagnosis not present

## 2017-06-27 DIAGNOSIS — J343 Hypertrophy of nasal turbinates: Secondary | ICD-10-CM | POA: Diagnosis not present

## 2017-06-27 DIAGNOSIS — R42 Dizziness and giddiness: Secondary | ICD-10-CM | POA: Diagnosis not present

## 2017-06-27 DIAGNOSIS — H838X3 Other specified diseases of inner ear, bilateral: Secondary | ICD-10-CM | POA: Diagnosis not present

## 2017-06-27 DIAGNOSIS — J310 Chronic rhinitis: Secondary | ICD-10-CM | POA: Diagnosis not present

## 2017-06-27 DIAGNOSIS — H9209 Otalgia, unspecified ear: Secondary | ICD-10-CM | POA: Diagnosis not present

## 2017-06-27 DIAGNOSIS — H903 Sensorineural hearing loss, bilateral: Secondary | ICD-10-CM | POA: Diagnosis not present

## 2017-07-27 DIAGNOSIS — M170 Bilateral primary osteoarthritis of knee: Secondary | ICD-10-CM | POA: Diagnosis not present

## 2017-07-27 DIAGNOSIS — I10 Essential (primary) hypertension: Secondary | ICD-10-CM | POA: Diagnosis not present

## 2017-07-27 DIAGNOSIS — E039 Hypothyroidism, unspecified: Secondary | ICD-10-CM | POA: Diagnosis not present

## 2017-07-27 DIAGNOSIS — F17210 Nicotine dependence, cigarettes, uncomplicated: Secondary | ICD-10-CM | POA: Diagnosis not present

## 2017-07-27 DIAGNOSIS — E559 Vitamin D deficiency, unspecified: Secondary | ICD-10-CM | POA: Diagnosis not present

## 2017-07-27 DIAGNOSIS — Z1159 Encounter for screening for other viral diseases: Secondary | ICD-10-CM | POA: Diagnosis not present

## 2017-07-27 DIAGNOSIS — Z0000 Encounter for general adult medical examination without abnormal findings: Secondary | ICD-10-CM | POA: Diagnosis not present

## 2017-07-27 DIAGNOSIS — E7800 Pure hypercholesterolemia, unspecified: Secondary | ICD-10-CM | POA: Diagnosis not present

## 2017-08-01 ENCOUNTER — Other Ambulatory Visit: Payer: Self-pay | Admitting: Acute Care

## 2017-08-01 DIAGNOSIS — F17210 Nicotine dependence, cigarettes, uncomplicated: Principal | ICD-10-CM

## 2017-08-01 DIAGNOSIS — Z122 Encounter for screening for malignant neoplasm of respiratory organs: Secondary | ICD-10-CM

## 2017-08-21 ENCOUNTER — Ambulatory Visit (INDEPENDENT_AMBULATORY_CARE_PROVIDER_SITE_OTHER)
Admission: RE | Admit: 2017-08-21 | Discharge: 2017-08-21 | Disposition: A | Discharge status: Home / Self Care | Payer: Medicare Other | Source: Ambulatory Visit | Attending: Acute Care | Admitting: Acute Care

## 2017-08-21 ENCOUNTER — Encounter: Payer: Self-pay | Admitting: Acute Care

## 2017-08-21 ENCOUNTER — Ambulatory Visit (INDEPENDENT_AMBULATORY_CARE_PROVIDER_SITE_OTHER): Payer: Medicare Other | Admitting: Acute Care

## 2017-08-21 DIAGNOSIS — F17210 Nicotine dependence, cigarettes, uncomplicated: Secondary | ICD-10-CM | POA: Diagnosis not present

## 2017-08-21 DIAGNOSIS — Z122 Encounter for screening for malignant neoplasm of respiratory organs: Secondary | ICD-10-CM

## 2017-08-21 NOTE — Progress Notes (Signed)
Shared Decision Making Visit Lung Cancer Screening Program [phone])   Eligibility:  Age 72 y.o.  Pack Years Smoking History Calculation 31 pack year smoking history (# packs/per year x # years smoked)  Recent History of coughing up blood  no  Unexplained weight loss? no ( >Than 15 pounds within the last 6 months )  Prior History Lung / other cancer no (Diagnosis within the last 5 years already requiring surveillance chest CT Scans).  Smoking Status Current Smoker  Former Smokers: Years since quit: NA  Quit Date: NA  Visit Components:  Discussion included one or more decision making aids. yes  Discussion included risk/benefits of screening. yes  Discussion included potential follow up diagnostic testing for abnormal scans. yes  Discussion included meaning and risk of over diagnosis. yes  Discussion included meaning and risk of False Positives. yes  Discussion included meaning of total radiation exposure. yes  Counseling Included:  Importance of adherence to annual lung cancer LDCT screening. yes  Impact of comorbidities on ability to participate in the program. yes  Ability and willingness to under diagnostic treatment. yes  Smoking Cessation Counseling:  Current Smokers:   Discussed importance of smoking cessation. yes  Information about tobacco cessation classes and interventions provided to patient. yes  Patient provided with "ticket" for LDCT Scan. yes  Symptomatic Patient. no  Counseling  Diagnosis Code: Tobacco Use Z72.0  Asymptomatic Patient yes  Counseling (Intermediate counseling: > three minutes counseling) N8295  Former Smokers:   Discussed the importance of maintaining cigarette abstinence. yes  Diagnosis Code: Personal History of Nicotine Dependence. A21.308  Information about tobacco cessation classes and interventions provided to patient. Yes  Patient provided with "ticket" for LDCT Scan. yes  Written Order for Lung Cancer  Screening with LDCT placed in Epic. Yes (CT Chest Lung Cancer Screening Low Dose W/O CM) [id number] Z12.2-Screening of respiratory organs Z87.891-Personal history of nicotine dependence    I have spent 25 minutes of face to face time with Mr. Dzikowski discussing the risks and benefits of lung cancer screening. We viewed a power point together that explained in detail the above noted topics. We paused at intervals to allow for questions to be asked and answered to ensure understanding.We discussed that the single most powerful action that he can take to decrease his risk of developing lung cancer is to quit smoking. We discussed whether or not he is ready to commit to setting a quit date. We discussed options for tools to aid in quitting smoking including nicotine replacement therapy, non-nicotine medications, support groups, Quit Smart classes, and behavior modification. We discussed that often times setting smaller, more achievable goals, such as eliminating 1 cigarette a day for a week and then 2 cigarettes a day for a week can be helpful in slowly decreasing the number of cigarettes smoked. This allows for a sense of accomplishment as well as providing a clinical benefit. I gave him the " Be Stronger Than Your Excuses" card with contact information for community resources, classes, free nicotine replacement therapy, and access to mobile apps, text messaging, and on-line smoking cessation help. I have also given him my card and contact information in the event he needs to contact me. We discussed the time and location of the scan, and that either Abigail Miyamoto RN or I will call with the results within 24-48 hours of receiving them. I have offered him  a copy of the power point we viewed  as a resource in the event they need  reinforcement of the concepts we discussed today in the office. The patient verbalized understanding of all of  the above and had no further questions upon leaving the office. They have my  contact information in the event they have any further questions.  I spent 4 minutes counseling on smoking cessation and the health risks of continued tobacco abuse.  I explained to the patient that there has been a high incidence of coronary artery disease noted on these exams. I explained that this is a non-gated exam therefore degree or severity cannot be determined. This patient is not on statin therapy. I have asked the patient to follow-up with their PCP regarding any incidental finding of coronary artery disease and management with diet or medication as their PCP  feels is clinically indicated. The patient verbalized understanding of the above and had no further questions upon completion of the visit.    Bevelyn NgoSarah F Hershal Eriksson, NP 08/21/2017  2:21 PM

## 2017-08-23 ENCOUNTER — Telehealth: Payer: Self-pay | Admitting: Acute Care

## 2017-08-23 DIAGNOSIS — F17210 Nicotine dependence, cigarettes, uncomplicated: Principal | ICD-10-CM

## 2017-08-23 DIAGNOSIS — Z122 Encounter for screening for malignant neoplasm of respiratory organs: Secondary | ICD-10-CM

## 2017-08-23 NOTE — Telephone Encounter (Signed)
Pt informed of CT results per Sarah Groce, NP.  PT verbalized understanding.  Copy sent to PCP.  Order placed for 1 yr f/u CT.  

## 2017-09-19 DIAGNOSIS — M5136 Other intervertebral disc degeneration, lumbar region: Secondary | ICD-10-CM | POA: Diagnosis not present

## 2017-09-19 DIAGNOSIS — M545 Low back pain: Secondary | ICD-10-CM | POA: Diagnosis not present

## 2017-10-16 DIAGNOSIS — Z125 Encounter for screening for malignant neoplasm of prostate: Secondary | ICD-10-CM | POA: Diagnosis not present

## 2017-10-16 DIAGNOSIS — N400 Enlarged prostate without lower urinary tract symptoms: Secondary | ICD-10-CM | POA: Diagnosis not present

## 2018-02-01 DIAGNOSIS — F17210 Nicotine dependence, cigarettes, uncomplicated: Secondary | ICD-10-CM | POA: Diagnosis not present

## 2018-02-01 DIAGNOSIS — I10 Essential (primary) hypertension: Secondary | ICD-10-CM | POA: Diagnosis not present

## 2018-02-01 DIAGNOSIS — E559 Vitamin D deficiency, unspecified: Secondary | ICD-10-CM | POA: Diagnosis not present

## 2018-02-01 DIAGNOSIS — E7800 Pure hypercholesterolemia, unspecified: Secondary | ICD-10-CM | POA: Diagnosis not present

## 2018-02-01 DIAGNOSIS — E039 Hypothyroidism, unspecified: Secondary | ICD-10-CM | POA: Diagnosis not present

## 2018-03-17 IMAGING — CR DG KNEE COMPLETE 4+V*L*
4 series · 4 of 4 positions shown · non-contrast
Comparison: None.

CLINICAL DATA: Bilateral knee pain for 2 weeks, right worse than
left. No known injury.

EXAM:
RIGHT KNEE - COMPLETE 4+ VIEW; LEFT KNEE - COMPLETE 4+ VIEW

[w knee ap left]
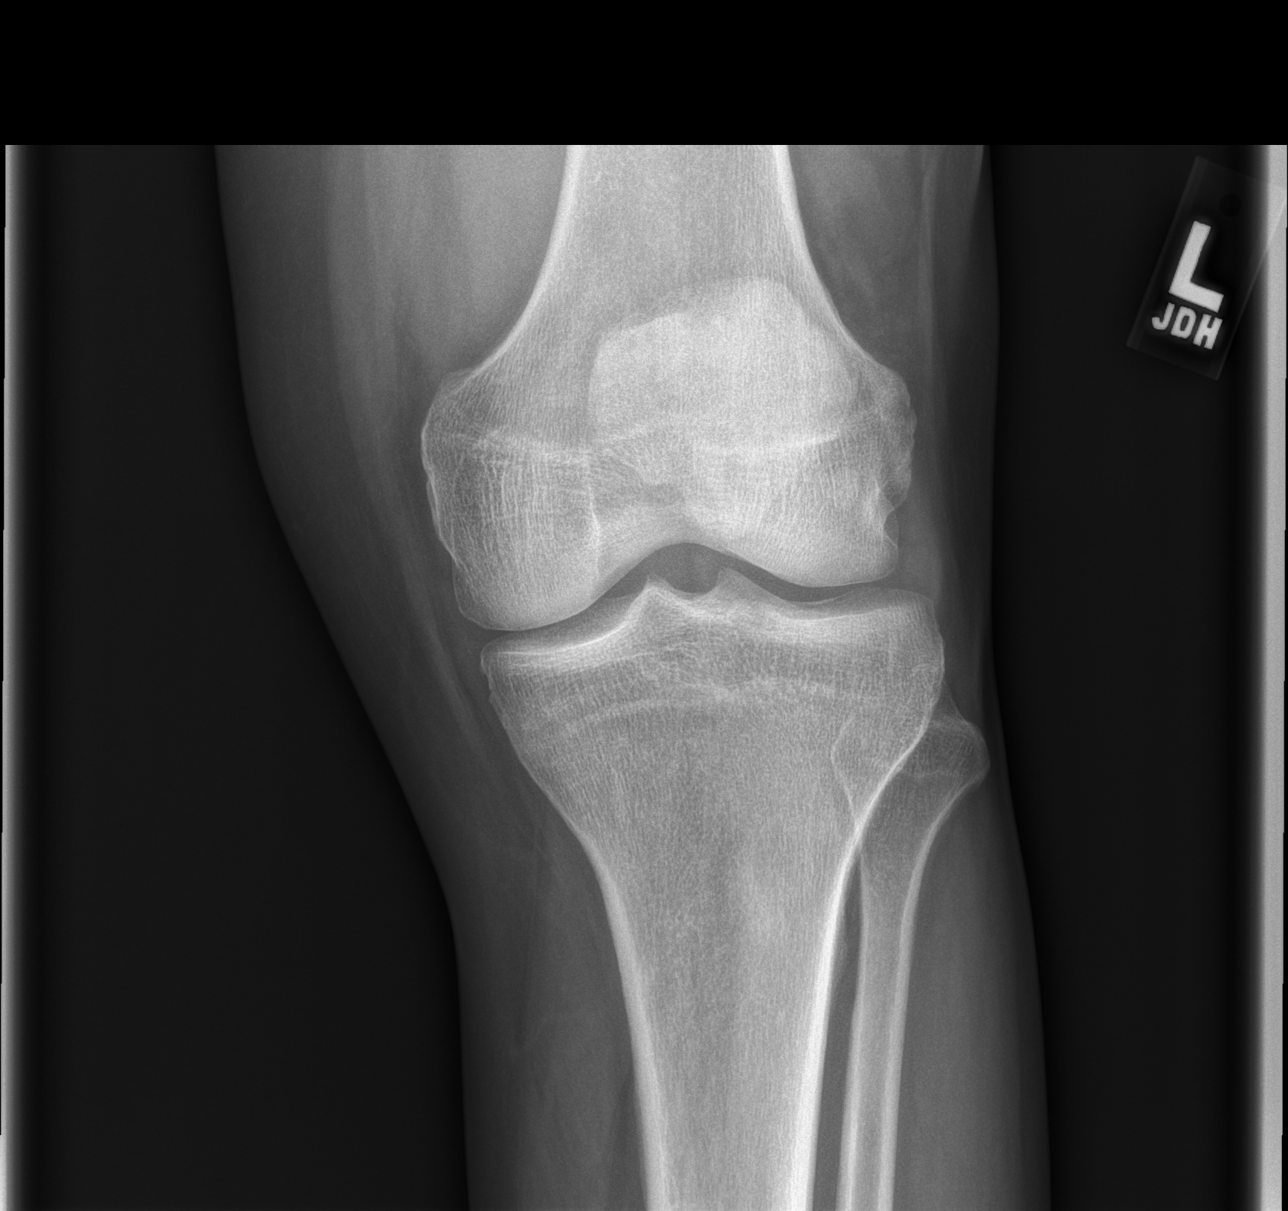

[w knee lat left]
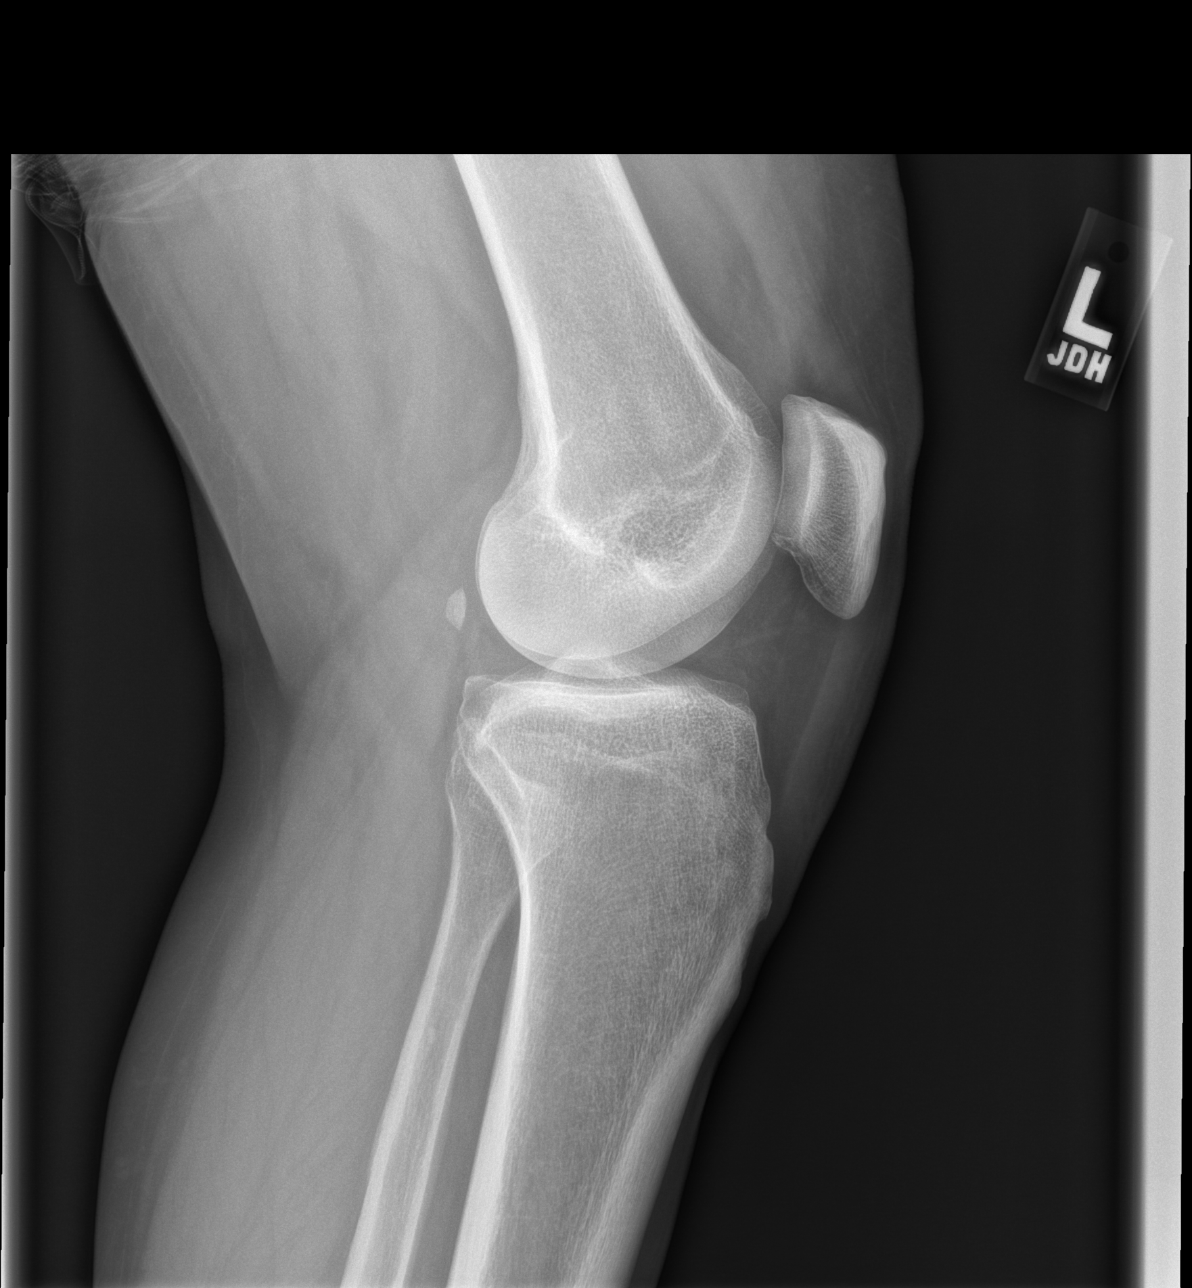

[x knee sunrise left]
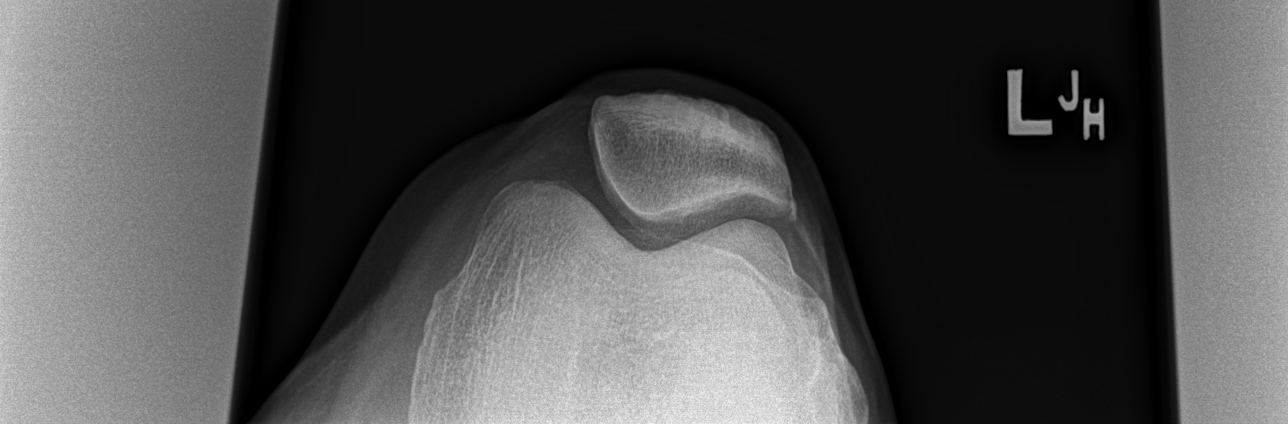

[x knee tunnel left]
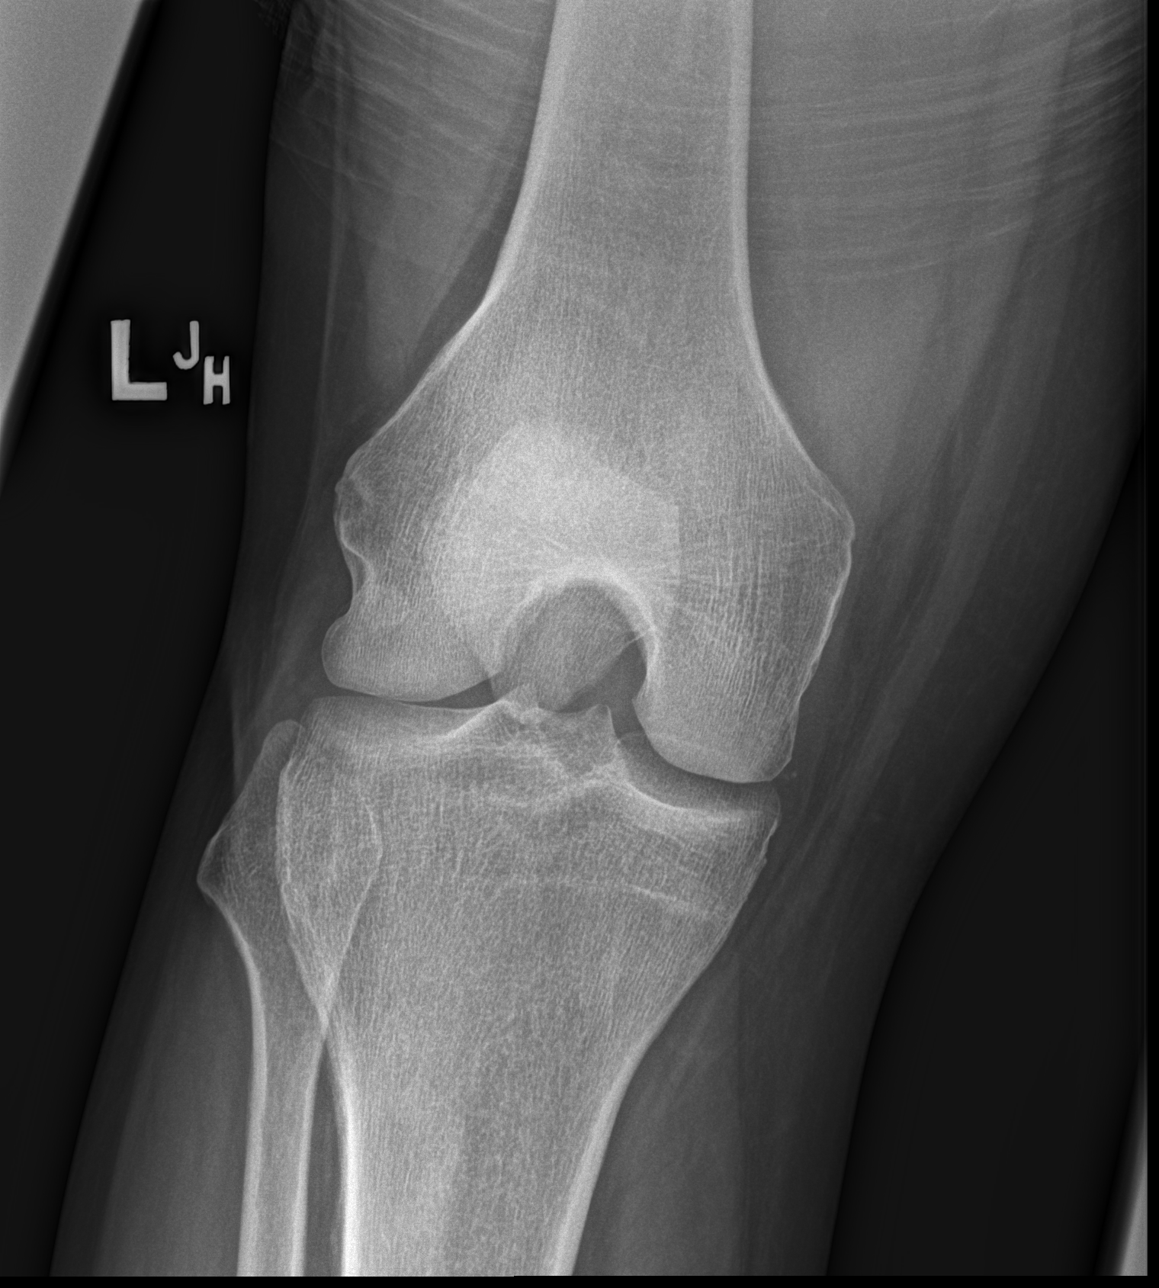

[4 of 4 positions shown; findings below may reference images not displayed]

FINDINGS: No evidence of fracture, dislocation, or joint effusion. No evidence
of arthropathy or other focal bone abnormality. Soft tissues are
unremarkable.
IMPRESSION: Normal exam.

## 2018-03-26 DIAGNOSIS — J0101 Acute recurrent maxillary sinusitis: Secondary | ICD-10-CM | POA: Diagnosis not present

## 2018-03-26 DIAGNOSIS — F17200 Nicotine dependence, unspecified, uncomplicated: Secondary | ICD-10-CM | POA: Diagnosis not present

## 2018-03-26 DIAGNOSIS — J3089 Other allergic rhinitis: Secondary | ICD-10-CM | POA: Diagnosis not present

## 2018-03-26 DIAGNOSIS — L309 Dermatitis, unspecified: Secondary | ICD-10-CM | POA: Diagnosis not present

## 2018-04-25 DIAGNOSIS — E559 Vitamin D deficiency, unspecified: Secondary | ICD-10-CM | POA: Diagnosis not present

## 2018-04-25 DIAGNOSIS — E039 Hypothyroidism, unspecified: Secondary | ICD-10-CM | POA: Diagnosis not present

## 2018-04-25 DIAGNOSIS — E7800 Pure hypercholesterolemia, unspecified: Secondary | ICD-10-CM | POA: Diagnosis not present

## 2018-05-03 DIAGNOSIS — E871 Hypo-osmolality and hyponatremia: Secondary | ICD-10-CM | POA: Diagnosis not present

## 2018-06-22 DIAGNOSIS — J310 Chronic rhinitis: Secondary | ICD-10-CM | POA: Diagnosis not present

## 2018-06-22 DIAGNOSIS — J0101 Acute recurrent maxillary sinusitis: Secondary | ICD-10-CM | POA: Diagnosis not present

## 2018-06-22 DIAGNOSIS — H6123 Impacted cerumen, bilateral: Secondary | ICD-10-CM | POA: Diagnosis not present

## 2018-06-22 DIAGNOSIS — J0121 Acute recurrent ethmoidal sinusitis: Secondary | ICD-10-CM | POA: Diagnosis not present

## 2018-09-28 ENCOUNTER — Other Ambulatory Visit: Payer: Self-pay | Admitting: *Deleted

## 2018-09-28 DIAGNOSIS — Z122 Encounter for screening for malignant neoplasm of respiratory organs: Secondary | ICD-10-CM

## 2018-09-28 DIAGNOSIS — Z87891 Personal history of nicotine dependence: Secondary | ICD-10-CM

## 2018-09-28 DIAGNOSIS — F17210 Nicotine dependence, cigarettes, uncomplicated: Secondary | ICD-10-CM

## 2018-10-10 DIAGNOSIS — E785 Hyperlipidemia, unspecified: Secondary | ICD-10-CM | POA: Diagnosis not present

## 2018-10-10 DIAGNOSIS — J309 Allergic rhinitis, unspecified: Secondary | ICD-10-CM | POA: Diagnosis not present

## 2018-10-10 DIAGNOSIS — E559 Vitamin D deficiency, unspecified: Secondary | ICD-10-CM | POA: Diagnosis not present

## 2018-10-10 DIAGNOSIS — L409 Psoriasis, unspecified: Secondary | ICD-10-CM | POA: Diagnosis not present

## 2018-10-10 DIAGNOSIS — I10 Essential (primary) hypertension: Secondary | ICD-10-CM | POA: Diagnosis not present

## 2018-10-10 DIAGNOSIS — Z0000 Encounter for general adult medical examination without abnormal findings: Secondary | ICD-10-CM | POA: Diagnosis not present

## 2018-10-10 DIAGNOSIS — E039 Hypothyroidism, unspecified: Secondary | ICD-10-CM | POA: Diagnosis not present

## 2018-10-10 DIAGNOSIS — H6120 Impacted cerumen, unspecified ear: Secondary | ICD-10-CM | POA: Diagnosis not present

## 2018-10-10 DIAGNOSIS — J019 Acute sinusitis, unspecified: Secondary | ICD-10-CM | POA: Diagnosis not present

## 2018-10-10 DIAGNOSIS — F17210 Nicotine dependence, cigarettes, uncomplicated: Secondary | ICD-10-CM | POA: Diagnosis not present

## 2018-10-15 DIAGNOSIS — F17200 Nicotine dependence, unspecified, uncomplicated: Secondary | ICD-10-CM | POA: Diagnosis not present

## 2018-10-15 DIAGNOSIS — L309 Dermatitis, unspecified: Secondary | ICD-10-CM | POA: Diagnosis not present

## 2018-10-15 DIAGNOSIS — J3089 Other allergic rhinitis: Secondary | ICD-10-CM | POA: Diagnosis not present

## 2018-10-15 DIAGNOSIS — J0101 Acute recurrent maxillary sinusitis: Secondary | ICD-10-CM | POA: Diagnosis not present

## 2018-11-06 ENCOUNTER — Inpatient Hospital Stay: Admission: RE | Admit: 2018-11-06 | Payer: Medicare Other | Source: Ambulatory Visit

## 2018-12-21 DIAGNOSIS — J0191 Acute recurrent sinusitis, unspecified: Secondary | ICD-10-CM | POA: Diagnosis not present

## 2018-12-31 DIAGNOSIS — E039 Hypothyroidism, unspecified: Secondary | ICD-10-CM | POA: Diagnosis not present

## 2019-06-07 ENCOUNTER — Other Ambulatory Visit: Payer: Self-pay | Admitting: Physician Assistant

## 2019-06-07 DIAGNOSIS — R1032 Left lower quadrant pain: Secondary | ICD-10-CM

## 2019-06-24 ENCOUNTER — Other Ambulatory Visit: Payer: Medicare Other

## 2019-07-04 ENCOUNTER — Other Ambulatory Visit: Payer: Self-pay | Admitting: Physician Assistant

## 2019-07-04 DIAGNOSIS — R1032 Left lower quadrant pain: Secondary | ICD-10-CM

## 2019-07-09 ENCOUNTER — Ambulatory Visit
Admission: RE | Admit: 2019-07-09 | Discharge: 2019-07-09 | Disposition: A | Discharge status: Home / Self Care | Payer: Medicare Other | Source: Ambulatory Visit | Attending: Physician Assistant | Admitting: Physician Assistant

## 2019-07-09 ENCOUNTER — Other Ambulatory Visit: Payer: Self-pay

## 2019-07-09 DIAGNOSIS — R1032 Left lower quadrant pain: Secondary | ICD-10-CM

## 2019-07-09 MED ORDER — IOPAMIDOL (ISOVUE-300) INJECTION 61%
100.0000 mL | Freq: Once | INTRAVENOUS | Status: AC | PRN
  Administered 2019-07-09: 15:00:00 100 mL via INTRAVENOUS

## 2019-11-22 ENCOUNTER — Other Ambulatory Visit: Payer: Self-pay

## 2019-11-22 ENCOUNTER — Emergency Department (HOSPITAL_COMMUNITY)
Admission: EM | Admit: 2019-11-22 | Discharge: 2019-11-22 | Disposition: A | Discharge status: Home / Self Care | Payer: No Typology Code available for payment source | Attending: Emergency Medicine | Admitting: Emergency Medicine

## 2019-11-22 ENCOUNTER — Encounter (HOSPITAL_COMMUNITY): Payer: Self-pay

## 2019-11-22 DIAGNOSIS — F17210 Nicotine dependence, cigarettes, uncomplicated: Secondary | ICD-10-CM | POA: Insufficient documentation

## 2019-11-22 DIAGNOSIS — N289 Disorder of kidney and ureter, unspecified: Secondary | ICD-10-CM | POA: Insufficient documentation

## 2019-11-22 DIAGNOSIS — I10 Essential (primary) hypertension: Secondary | ICD-10-CM | POA: Diagnosis not present

## 2019-11-22 DIAGNOSIS — Z79890 Hormone replacement therapy (postmenopausal): Secondary | ICD-10-CM | POA: Insufficient documentation

## 2019-11-22 DIAGNOSIS — Z7982 Long term (current) use of aspirin: Secondary | ICD-10-CM | POA: Diagnosis not present

## 2019-11-22 DIAGNOSIS — E871 Hypo-osmolality and hyponatremia: Secondary | ICD-10-CM | POA: Insufficient documentation

## 2019-11-22 DIAGNOSIS — Z79899 Other long term (current) drug therapy: Secondary | ICD-10-CM | POA: Diagnosis not present

## 2019-11-22 DIAGNOSIS — E039 Hypothyroidism, unspecified: Secondary | ICD-10-CM | POA: Insufficient documentation

## 2019-11-22 LAB — CREATININE, URINE, RANDOM: Creatinine, Urine: 268.95 mg/dL

## 2019-11-22 LAB — URINALYSIS, ROUTINE W REFLEX MICROSCOPIC
Bacteria, UA: NONE SEEN
Bilirubin Urine: NEGATIVE
Glucose, UA: NEGATIVE mg/dL
Ketones, ur: 20 mg/dL — AB
Leukocytes,Ua: NEGATIVE
Nitrite: NEGATIVE
Protein, ur: NEGATIVE mg/dL
Specific Gravity, Urine: 1.018 (ref 1.005–1.030)
pH: 5 (ref 5.0–8.0)

## 2019-11-22 LAB — CBC
HCT: 40.7 % (ref 39.0–52.0)
Hemoglobin: 14.4 g/dL (ref 13.0–17.0)
MCH: 35.6 pg — ABNORMAL HIGH (ref 26.0–34.0)
MCHC: 35.4 g/dL (ref 30.0–36.0)
MCV: 100.5 fL — ABNORMAL HIGH (ref 80.0–100.0)
Platelets: 194 10*3/uL (ref 150–400)
RBC: 4.05 MIL/uL — ABNORMAL LOW (ref 4.22–5.81)
RDW: 12.3 % (ref 11.5–15.5)
WBC: 6.2 10*3/uL (ref 4.0–10.5)
nRBC: 0 % (ref 0.0–0.2)

## 2019-11-22 LAB — COMPREHENSIVE METABOLIC PANEL
ALT: 12 U/L (ref 0–44)
AST: 23 U/L (ref 15–41)
Albumin: 4.9 g/dL (ref 3.5–5.0)
Alkaline Phosphatase: 53 U/L (ref 38–126)
Anion gap: 12 (ref 5–15)
BUN: 15 mg/dL (ref 8–23)
CO2: 24 mmol/L (ref 22–32)
Calcium: 9.8 mg/dL (ref 8.9–10.3)
Chloride: 93 mmol/L — ABNORMAL LOW (ref 98–111)
Creatinine, Ser: 1.45 mg/dL — ABNORMAL HIGH (ref 0.61–1.24)
GFR calc Af Amer: 54 mL/min — ABNORMAL LOW (ref >60)
GFR calc non Af Amer: 47 mL/min — ABNORMAL LOW (ref >60)
Glucose, Bld: 86 mg/dL (ref 70–99)
Potassium: 4.1 mmol/L (ref 3.5–5.1)
Sodium: 129 mmol/L — ABNORMAL LOW (ref 135–145)
Total Bilirubin: 0.8 mg/dL (ref 0.3–1.2)
Total Protein: 8.1 g/dL (ref 6.5–8.1)

## 2019-11-22 LAB — SODIUM, URINE, RANDOM: Sodium, Ur: 68 mmol/L

## 2019-11-22 NOTE — Discharge Instructions (Addendum)
Your sodium was 129 when we checked it.  This is not severe and you do not have any symptoms of hyponatremia.  For now, liberalize your salt intake for several days.  Also try to drink a quart of water a day since it appears that you are mildly dehydrated.  See your doctor next week for checkup.

## 2019-11-22 NOTE — ED Triage Notes (Signed)
Patient states he had a physical at the [facility] today. Patient states he was called and informed to come to the Ed for a NA+ level-124.

## 2019-11-22 NOTE — ED Notes (Signed)
Patient able to tolerate water and crackers.

## 2019-11-22 NOTE — ED Provider Notes (Signed)
Hyponatremia algorithm  [facility]   CSN: [id number] Arrival date & time: 11/22/19  1713     History Chief Complaint  Patient presents with  . Abnormal Lab    Franki CabotWilliam Eastwood is a 74 y.o. male.  HPI He presents for evaluation of hyponatremia.  Patient [facility] and had routine blood work done.  He was told that his sodium was 124.  He does not know of any other abnormalities.  He denies any recent illnesses.  He has not had any recent changes in medications.  He takes lisinopril, but no diuretics.  No recent fever, chills, nausea, vomiting, diarrhea, dysuria, urinary frequency, abdominal pain or back pain.  He is taking all of his usual medications.  There are no other known modifying factors.    Past Medical History:  Diagnosis Date  . Allergic rhinitis   . BPH (benign prostatic hyperplasia)     Dr. Brunilda PayorNesi  . Hyperlipidemia   . Hypertension   . Hypothyroidism    Unspecified   . Neutropenia (HCC)    Neutropenia and macrocytosis  . Pain    Joint pains, multiple - including DDD  . Psoriasis   . Raynaud's phenomenon   . Tobacco abuse   . Tobacco abuse   . Vitamin D deficiency     There are no problems to display for this patient.   History reviewed. No pertinent surgical history.     Family History  Problem Relation Age of Onset  . Osteoarthritis Mother   . Hypertension Mother   . Heart attack Father   . Gout Sister   . Diabetes Sister   . Diabetes Brother   . Heart disease Brother   . Gout Brother   . Diabetes Brother     Social History   Tobacco Use  . Smoking status: Current Every Day Smoker    Packs/day: 1.00    Years: 42.00    Pack years: 42.00    Types: Cigarettes  . Smokeless tobacco: Never Used  Vaping Use  . Vaping Use: Never used  Substance Use Topics  . Alcohol use: No  . Drug use: No    Home Medications Prior to Admission medications   Medication Sig  Start Date End Date Taking? Authorizing Provider  acetaminophen (TYLENOL) 500 MG tablet Take 500 mg by mouth every 6 (six) hours as needed for mild pain.   Yes [provider]  aspirin 81 MG tablet Take 81 mg by mouth daily. Aspir-81 81 MG Tablet Delayed Release 1 tablet once a day   Yes [provider]  Cholecalciferol (VITAMIN D PO) Take 1 tablet by mouth daily as needed (nutrition).    Yes [provider]  lisinopril (PRINIVIL,ZESTRIL) 10 MG tablet Take 10 mg by mouth daily.    Yes [provider]  Multiple Vitamin (MULTIVITAMIN) tablet Take 1 tablet by mouth daily. For Him 50+ Tablet 1 tablet once a day   Yes [provider]  SYNTHROID 75 MCG tablet Take 75 mcg by mouth daily.  05/25/17  Yes [provider]    Allergies    Avelox [moxifloxacin hcl in nacl], Levaquin [levofloxacin in d5w], and Penicillins  Review of Systems   Review of Systems  All other systems reviewed and are negative.   Physical Exam Updated Vital Signs BP (!) 165/90   Pulse 71   Temp 98 F (36.7 C) (Oral)   Resp  16   Ht 5\' 6"  (1.676 m)   Wt 77.6 kg   SpO2 98%   BMI 27.60 kg/m   Physical Exam Vitals and nursing note reviewed.  Constitutional:      General: He is not in acute distress.    Appearance: He is well-developed and normal weight. He is not ill-appearing, toxic-appearing or diaphoretic.  HENT:     Head: Normocephalic and atraumatic.     Right Ear: External ear normal.     Left Ear: External ear normal.  Eyes:     Conjunctiva/sclera: Conjunctivae normal.     Pupils: Pupils are equal, round, and reactive to light.  Neck:     Trachea: Phonation normal.  Cardiovascular:     Rate and Rhythm: Normal rate and regular rhythm.     Heart sounds: Normal heart sounds.  Pulmonary:     Effort: Pulmonary effort is normal.     Breath sounds: Normal breath sounds.  Abdominal:     General: There is no distension.     Palpations: Abdomen is soft.      Tenderness: There is no abdominal tenderness. There is no guarding.  Musculoskeletal:        General: Normal range of motion.     Cervical back: Normal range of motion and neck supple.  Skin:    General: Skin is warm and dry.  Neurological:     Mental Status: He is alert and oriented to person, place, and time.     Cranial Nerves: No cranial nerve deficit.     Sensory: No sensory deficit.     Motor: No abnormal muscle tone.     Coordination: Coordination normal.  Psychiatric:        Mood and Affect: Mood normal.        Behavior: Behavior normal.        Thought Content: Thought content normal.        Judgment: Judgment normal.     ED Results / Procedures / Treatments   Labs (all labs ordered are listed, but only abnormal results are displayed) Labs Reviewed  COMPREHENSIVE METABOLIC PANEL - Abnormal; Notable for the following components:      Result Value   Sodium 129 (*)    Chloride 93 (*)    Creatinine, Ser 1.45 (*)    GFR calc non Af Amer 47 (*)    GFR calc Af Amer 54 (*)    All other components within normal limits  CBC - Abnormal; Notable for the following components:   RBC 4.05 (*)    MCV 100.5 (*)    MCH 35.6 (*)    All other components within normal limits  URINALYSIS, ROUTINE W REFLEX MICROSCOPIC - Abnormal; Notable for the following components:   Hgb urine dipstick SMALL (*)    Ketones, ur 20 (*)    All other components within normal limits  CREATININE, URINE, RANDOM  SODIUM, URINE, RANDOM    EKG None  Radiology No results found.  Procedures Procedures (including critical care time)  Medications Ordered in ED Medications - No data to display  ED Course  I have reviewed the triage vital signs and the nursing notes.  Pertinent labs & imaging results that were available during my care of the patient were reviewed by me and considered in my medical decision making (see chart for details).  Clinical Course as of Nov 22 2303  Fri Nov 22, 2019  2240  Normal except sodium low 129, chloride low at  93, creatinine high 1.45, GFR low  Comprehensive metabolic panel(!) [EW]  2240 Normal except MCV high  CBC(!) [EW]  2240 Normal except small amount of hemoglobin and ketones  Urinalysis, Routine w reflex microscopic Urine, Random(!) [EW]  2250 Normal  Sodium, urine, random [EW]  2250 Normal   [EW]    Clinical Course User Index [EW] Mancel Bale, MD   MDM Rules/Calculators/A&P                           Patient Vitals for the past 24 hrs:  BP Temp Temp src Pulse Resp SpO2 Height Weight  11/22/19 2230 (!) 165/90 -- -- 71 16 98 % -- --  11/22/19 2215 (!) 158/77 -- -- 79 -- 98 % -- --  11/22/19 2200 (!) 159/85 -- -- 73 -- 99 % -- --  11/22/19 2145 (!) 158/83 -- -- 74 -- 99 % -- --  11/22/19 2130 (!) 177/79 -- -- 74 -- 99 % -- --  11/22/19 2115 (!) 175/78 -- -- 78 -- 100 % -- --  11/22/19 2100 (!) 180/80 -- -- 83 -- 99 % -- --  11/22/19 2041 (!) 180/83 -- -- 88 18 98 % -- --  11/22/19 1939 (!) 169/90 -- -- 84 16 94 % -- --  11/22/19 1746 -- -- -- -- -- -- 5\' 6"  (1.676 m) 77.6 kg  11/22/19 1744 (!) 159/82 98 F (36.7 C) Oral 92 16 99 % -- --    At discharge reevaluation with update and discussion. After initial assessment and treatment, an updated evaluation reveals he is comfortable has no further complaints.  Findings discussed and questions answered. 01/22/20   Medical Decision Making:  This patient is presenting for evaluation of hyponatremia, which does require a range of treatment options, and is a complaint that involves a moderate risk of morbidity and mortality. The differential diagnoses include acute or chronic hyponatremia, metabolic instead building, intravascular fluid disorder. I decided to review old records, and in summary healthy male presented to his PCP today for evaluation and had routine labs done which showed incidental hyponatremia.  I did not require additional historical information from  anyone.  Clinical Laboratory Tests Ordered, included CBC, Metabolic panel and Urinalysis, urine sodium, urine creatinine. Review indicates sodium slightly low 129, chloride low 93, creatinine high 1.45, MCV high urine sodium and creatinine are reassuring, urinalysis normal.     Critical Interventions-evaluation, laboratory testing, observation reassessment  After These Interventions, the Patient was reevaluated and was found stable for discharge.  Patient with incidental hyponatremia, mild, with evidence for dehydration and clinical absent signs for hyponatremia.  This likely represents incidental likely significant hyponatremia that can improve with hydration, dietary measures.  For hospitalization at this time.  CRITICAL CARE-no Performed by: Mancel Bale  Nursing Notes Reviewed/ Care Coordinated Applicable Imaging Reviewed Interpretation of Laboratory Data incorporated into ED treatment  The patient appears reasonably screened and/or stabilized for discharge and I doubt any other medical condition or other [facility] requiring further screening, evaluation, or treatment in the ED at this time prior to discharge.  Plan: Home Medications-continue usual; Home Treatments-rest, fluids; return here if the recommended treatment, does not improve the symptoms; Recommended follow up-PCP 1 week and as needed     Final Clinical Impression(s) / ED Diagnoses Final diagnoses:  Hyponatremia  Renal insufficiency    Rx / DC Orders ED Discharge Orders    None  Mancel Bale, MD 11/23/19 [phone]
